# Patient Record
Sex: Male | Born: 1945
Health system: Southern US, Community
[De-identification: ages and names within clinical notes are randomized; demographics above are authoritative.]

## PROBLEM LIST (undated history)

## (undated) DIAGNOSIS — M1990 Unspecified osteoarthritis, unspecified site: Secondary | ICD-10-CM

## (undated) DIAGNOSIS — D696 Thrombocytopenia, unspecified: Principal | ICD-10-CM

## (undated) DIAGNOSIS — I10 Essential (primary) hypertension: Secondary | ICD-10-CM

## (undated) DIAGNOSIS — E119 Type 2 diabetes mellitus without complications: Secondary | ICD-10-CM

## (undated) HISTORY — DX: Type 2 diabetes mellitus without complications: E11.9

## (undated) HISTORY — DX: Essential (primary) hypertension: I10

## (undated) HISTORY — DX: Thrombocytopenia, unspecified: D69.6

## (undated) HISTORY — DX: Unspecified osteoarthritis, unspecified site: M19.90

---

## 2007-04-29 ENCOUNTER — Inpatient Hospital Stay (HOSPITAL_COMMUNITY): Admission: RE | Admit: 2007-04-29 | Discharge: 2007-05-04 | Payer: Self-pay | Admitting: Orthopedic Surgery

## 2008-08-24 ENCOUNTER — Inpatient Hospital Stay (HOSPITAL_COMMUNITY): Admission: EM | Admit: 2008-08-24 | Discharge: 2008-08-27 | Payer: Self-pay | Admitting: Emergency Medicine

## 2010-10-09 LAB — CK: Total CK: 457 U/L — ABNORMAL HIGH (ref 7–232)

## 2010-10-09 LAB — COMPREHENSIVE METABOLIC PANEL
ALT: 50 U/L (ref 0–53)
Alkaline Phosphatase: 119 U/L — ABNORMAL HIGH (ref 39–117)
Calcium: 8.5 mg/dL (ref 8.4–10.5)
Glucose, Bld: 145 mg/dL — ABNORMAL HIGH (ref 70–99)
Sodium: 140 mEq/L (ref 135–145)
Total Bilirubin: 0.8 mg/dL (ref 0.3–1.2)
Total Protein: 6.1 g/dL (ref 6.0–8.3)

## 2010-10-09 LAB — CBC
HCT: 39.4 % (ref 39.0–52.0)
MCHC: 33.7 g/dL (ref 30.0–36.0)
MCV: 84.1 fL (ref 78.0–100.0)

## 2010-10-09 LAB — GLUCOSE, CAPILLARY: Glucose-Capillary: 150 mg/dL — ABNORMAL HIGH (ref 70–99)

## 2010-10-14 LAB — GLUCOSE, CAPILLARY
Glucose-Capillary: 137 mg/dL — ABNORMAL HIGH (ref 70–99)
Glucose-Capillary: 155 mg/dL — ABNORMAL HIGH (ref 70–99)
Glucose-Capillary: 159 mg/dL — ABNORMAL HIGH (ref 70–99)
Glucose-Capillary: 183 mg/dL — ABNORMAL HIGH (ref 70–99)
Glucose-Capillary: 184 mg/dL — ABNORMAL HIGH (ref 70–99)
Glucose-Capillary: 198 mg/dL — ABNORMAL HIGH (ref 70–99)
Glucose-Capillary: 205 mg/dL — ABNORMAL HIGH (ref 70–99)
Glucose-Capillary: 208 mg/dL — ABNORMAL HIGH (ref 70–99)
Glucose-Capillary: 209 mg/dL — ABNORMAL HIGH (ref 70–99)
Glucose-Capillary: 223 mg/dL — ABNORMAL HIGH (ref 70–99)
Glucose-Capillary: 242 mg/dL — ABNORMAL HIGH (ref 70–99)
Glucose-Capillary: 252 mg/dL — ABNORMAL HIGH (ref 70–99)
Glucose-Capillary: 262 mg/dL — ABNORMAL HIGH (ref 70–99)
Glucose-Capillary: 289 mg/dL — ABNORMAL HIGH (ref 70–99)
Glucose-Capillary: 331 mg/dL — ABNORMAL HIGH (ref 70–99)
Glucose-Capillary: 68 mg/dL — ABNORMAL LOW (ref 70–99)
Glucose-Capillary: 89 mg/dL (ref 70–99)
Glucose-Capillary: >600 mg/dL (ref 70–99)
Glucose-Capillary: >600 mg/dL (ref 70–99)
Glucose-Capillary: >600 mg/dL (ref 70–99)

## 2010-10-14 LAB — BASIC METABOLIC PANEL
BUN: 11 mg/dL (ref 6–23)
BUN: 24 mg/dL — ABNORMAL HIGH (ref 6–23)
BUN: 24 mg/dL — ABNORMAL HIGH (ref 6–23)
CO2: 23 mEq/L (ref 19–32)
CO2: 27 mEq/L (ref 19–32)
CO2: 31 mEq/L (ref 19–32)
Calcium: 8.1 mg/dL — ABNORMAL LOW (ref 8.4–10.5)
Calcium: 8.1 mg/dL — ABNORMAL LOW (ref 8.4–10.5)
Chloride: 112 mEq/L (ref 96–112)
Chloride: 117 mEq/L — ABNORMAL HIGH (ref 96–112)
Chloride: 119 mEq/L — ABNORMAL HIGH (ref 96–112)
Creatinine, Ser: 0.94 mg/dL (ref 0.4–1.5)
Creatinine, Ser: 1.51 mg/dL — ABNORMAL HIGH (ref 0.4–1.5)
GFR calc Af Amer: 45 mL/min — ABNORMAL LOW (ref >60)
GFR calc Af Amer: 57 mL/min — ABNORMAL LOW (ref >60)
GFR calc Af Amer: >60 mL/min (ref >60)
GFR calc Af Amer: >60 mL/min (ref >60)
GFR calc non Af Amer: 47 mL/min — ABNORMAL LOW (ref >60)
GFR calc non Af Amer: 57 mL/min — ABNORMAL LOW (ref >60)
GFR calc non Af Amer: >60 mL/min (ref >60)
Glucose, Bld: 186 mg/dL — ABNORMAL HIGH (ref 70–99)
Glucose, Bld: 436 mg/dL — ABNORMAL HIGH (ref 70–99)
Glucose, Bld: 95 mg/dL (ref 70–99)
Potassium: 3.4 mEq/L — ABNORMAL LOW (ref 3.5–5.1)
Potassium: 3.6 mEq/L (ref 3.5–5.1)
Potassium: 4.2 mEq/L (ref 3.5–5.1)
Sodium: 139 mEq/L (ref 135–145)
Sodium: 144 mEq/L (ref 135–145)
Sodium: 154 mEq/L — ABNORMAL HIGH (ref 135–145)
Sodium: 157 mEq/L — ABNORMAL HIGH (ref 135–145)

## 2010-10-14 LAB — COMPREHENSIVE METABOLIC PANEL
ALT: 100 U/L — ABNORMAL HIGH (ref 0–53)
ALT: 52 U/L (ref 0–53)
AST: 53 U/L — ABNORMAL HIGH (ref 0–37)
AST: 67 U/L — ABNORMAL HIGH (ref 0–37)
Albumin: 3 g/dL — ABNORMAL LOW (ref 3.5–5.2)
Albumin: 4.5 g/dL (ref 3.5–5.2)
Alkaline Phosphatase: 131 U/L — ABNORMAL HIGH (ref 39–117)
BUN: 18 mg/dL (ref 6–23)
BUN: 27 mg/dL — ABNORMAL HIGH (ref 6–23)
CO2: 27 mEq/L (ref 19–32)
Calcium: 7.8 mg/dL — ABNORMAL LOW (ref 8.4–10.5)
Calcium: 9.2 mg/dL (ref 8.4–10.5)
Chloride: 108 mEq/L (ref 96–112)
Creatinine, Ser: 1.3 mg/dL (ref 0.4–1.5)
GFR calc Af Amer: 41 mL/min — ABNORMAL LOW (ref >60)
GFR calc Af Amer: >60 mL/min (ref >60)
GFR calc non Af Amer: 56 mL/min — ABNORMAL LOW (ref >60)
Glucose, Bld: 348 mg/dL — ABNORMAL HIGH (ref 70–99)
Potassium: 4.2 mEq/L (ref 3.5–5.1)
Potassium: 5 mEq/L (ref 3.5–5.1)
Sodium: 136 mEq/L (ref 135–145)
Sodium: 141 mEq/L (ref 135–145)
Total Bilirubin: 0.9 mg/dL (ref 0.3–1.2)
Total Protein: 5.8 g/dL — ABNORMAL LOW (ref 6.0–8.3)

## 2010-10-14 LAB — HOMOCYSTEINE: Homocysteine: 18.4 umol/L — ABNORMAL HIGH (ref 4.0–15.4)

## 2010-10-14 LAB — LIPID PANEL
Total CHOL/HDL Ratio: 6.8 RATIO
Triglycerides: 175 mg/dL — ABNORMAL HIGH (ref <150)
VLDL: 35 mg/dL (ref 0–40)

## 2010-10-14 LAB — CBC
HCT: 39.4 % (ref 39.0–52.0)
HCT: 53.1 % — ABNORMAL HIGH (ref 39.0–52.0)
Hemoglobin: 12.9 g/dL — ABNORMAL LOW (ref 13.0–17.0)
Hemoglobin: 15.8 g/dL (ref 13.0–17.0)
Hemoglobin: 16.8 g/dL (ref 13.0–17.0)
MCHC: 31.6 g/dL (ref 30.0–36.0)
MCHC: 32.8 g/dL (ref 30.0–36.0)
MCHC: 33.5 g/dL (ref 30.0–36.0)
MCHC: 33.6 g/dL (ref 30.0–36.0)
MCV: 84.9 fL (ref 78.0–100.0)
Platelets: 98 10*3/uL — ABNORMAL LOW (ref 150–400)
RBC: 5.54 MIL/uL (ref 4.22–5.81)
RDW: 14.3 % (ref 11.5–15.5)
RDW: 14.6 % (ref 11.5–15.5)
RDW: 14.6 % (ref 11.5–15.5)
RDW: 14.6 % (ref 11.5–15.5)
WBC: 6.9 10*3/uL (ref 4.0–10.5)

## 2010-10-14 LAB — HEPATIC FUNCTION PANEL
Albumin: 4.5 g/dL (ref 3.5–5.2)
Alkaline Phosphatase: 221 U/L — ABNORMAL HIGH (ref 39–117)
Bilirubin, Direct: <0.1 mg/dL (ref 0.0–0.3)

## 2010-10-14 LAB — POCT I-STAT 3, VENOUS BLOOD GAS (G3P V)
Acid-Base Excess: 4 mmol/L — ABNORMAL HIGH (ref 0.0–2.0)
O2 Saturation: 51 %
pCO2, Ven: 60.4 mmHg — ABNORMAL HIGH (ref 45.0–50.0)
pH, Ven: 7.345 — ABNORMAL HIGH (ref 7.250–7.300)

## 2010-10-14 LAB — DIFFERENTIAL
Eosinophils Absolute: 0 10*3/uL (ref 0.0–0.7)
Eosinophils Relative: 0 % (ref 0–5)
Lymphocytes Relative: 8 % — ABNORMAL LOW (ref 12–46)
Neutrophils Relative %: 88 % — ABNORMAL HIGH (ref 43–77)

## 2010-10-14 LAB — CK TOTAL AND CKMB (NOT AT ARMC)
CK, MB: 2.8 ng/mL (ref 0.3–4.0)
Relative Index: 0.5 (ref 0.0–2.5)
Total CK: 610 U/L — ABNORMAL HIGH (ref 7–232)

## 2010-10-14 LAB — HEMOGLOBIN A1C: Mean Plasma Glucose: 243 mg/dL

## 2010-10-14 LAB — PROTIME-INR
INR: 1.1 (ref 0.00–1.49)
Prothrombin Time: 15 seconds (ref 11.6–15.2)

## 2010-10-14 LAB — CK: Total CK: 756 U/L — ABNORMAL HIGH (ref 7–232)

## 2010-10-14 LAB — TROPONIN I: Troponin I: 0.01 ng/mL (ref 0.00–0.06)

## 2010-10-14 LAB — CARDIAC PANEL(CRET KIN+CKTOT+MB+TROPI)
CK, MB: 3.4 ng/mL (ref 0.3–4.0)
Troponin I: 0.01 ng/mL (ref 0.00–0.06)

## 2010-10-14 LAB — APTT: aPTT: 36 seconds (ref 24–37)

## 2010-11-11 NOTE — H&P (Signed)
NAME:  Donald Mclaughlin, Donald Mclaughlin.:  [id number]   MEDICAL RECORD NO.:  [id number]          PATIENT TYPE:  EMS   LOCATION:  MAJO                         FACILITY:  [facility]   PHYSICIAN:  Vania Rea, M.D. DATE OF BIRTH:  1945-12-01   DATE OF ADMISSION:  08/23/2008  DATE OF DISCHARGE:                              HISTORY & PHYSICAL   PRIMARY CARE PHYSICIAN:  Ernestina Penna, M.D. at [facility].   CHIEF COMPLAINT:  Progressive fatigue x1 week.   HISTORY OF PRESENT ILLNESS:  This is a 65 year old African American  gentleman who denies any significant past medical history other than a  hip replacement in December, 2008.  He does not usually visit doctors  and feels that he was in good health until about 1 week ago when he  started noticing progressive fatigue.  Over the past 2 to 3 days it has  become much worse.  His daughter reports that he has been complaining  that his mouth is very dry.  He has been having blurred vision,  increasing urinary frequency, and thirst.  Eventually, his daughter  persuaded him to come to the emergency room, where he was found to have  a markedly elevated blood sugar, and hospitalist service called to  assist with management.  Patient denies any family history of diabetes.  He specifically denies alcohol use.  His daughter feels that he is  losing weight.  He does not notice any weight loss.  He has not noticed  any change in the girth of his abdomen.   PAST MEDICAL HISTORY:  Osteoarthritis and right hip replacement in  December, 2008.   MEDICATIONS:  None.   ALLERGIES:  No known drug allergies.   SOCIAL HISTORY:  Used to smoke a pack of 5 cigars every 2 days and has  smoked for about 30 years but discontinued at the beginning of this  year.  Denies any alcohol or illicit drug use.  Used to work for [employer].   FAMILY HISTORY:  Significant for a mother who died of cancer in her  back.  Unable to locate  any further but denies any family history of  other medical problems.   REVIEW OF SYSTEMS:  Other than noted above, a 10-point review of systems  is negative for any questions.   PHYSICAL EXAMINATION:  A dehydrated middle-aged [place] American  gentleman reclining in the stretcher.  VITALS:  Temperature is not recorded.  He is afebrile.  His pulse is  104, respirations 18, blood pressure 124/78.  He is saturating at 94% on  room air.  Pupils are equal and round.  Mucous membranes are pink and anicteric.  He is dehydrated.  No cervical lymphadenopathy or thyromegaly.  No  jugular venous distention.  CHEST:  Clear to auscultation bilaterally.  CARDIOVASCULAR:  Regular rhythm without murmur.  ABDOMEN:  Obese.  He does have flank dullness but umbilicus is not  diverted.  EXTREMITIES:  Without edema.  He has 2+ pulses bilaterally.  SKIN:  Without rash.  CENTRAL NERVOUS SYSTEM:  Cranial nerves II-XII  are grossly intact.  He  has no focal neurological deficits.   LABS:  White count is 6.9, hemoglobin 16.8, platelets 115.  Absolute  granulocyte count is elevated at 88.  Serum chemistry is significant for  a sodium of 136, potassium 5, chloride 92, CO2 29, glucose 1312, BUN 27,  creatinine 2.7.  calcium is 9.2, total protein 9.1, albumin 4.5, AST 67,  ALT 100, alkaline phosphatase 213.  Total bilirubin is 0.8.  His pH on a  venous blood sample is 7.34.   A 2-view chest x-ray shows atelectasis or scarring at both bases.  Could  not rule out atelectatic pneumonia.   ASSESSMENT:  1. Newly discovered diabetes.  2. Hyperosmolar nonketotic state.  3. Thrombocytopenia.  4. Abnormal liver function tests.  5. Acute renal failure.   PLAN:  We will admit this gentleman for hydration.  We will monitor his  serum chemistry every 4 hours.  We will place him on Glucommander.  Sudden and apparently rapid onset of this diabetes is concerning for  malignancy.  We will get an ultrasound of his abdomen  with particular  attention to his liver and his pancreas.  Other plans as per orders.      Vania Rea, M.D.  Electronically Signed     LC/MEDQ  D:  08/24/2008  T:  08/24/2008  Job:  [id number]   cc:   Ernestina Penna, M.D.  Lorre Nick, M.D.

## 2010-11-11 NOTE — Discharge Summary (Signed)
NAME:  Donald Mclaughlin, CUPP NO.:  [id number]   MEDICAL RECORD NO.:  [id number]          PATIENT TYPE:  INP   LOCATION:  4705                         FACILITY:  [facility]   PHYSICIAN:  Marcellus Scott, MD     DATE OF BIRTH:  September 10, 1945   DATE OF ADMISSION:  08/23/2008  DATE OF DISCHARGE:  08/27/2008                               DISCHARGE SUMMARY   PRIMARY MEDICAL DOCTOR:  Gentry Fitz.   DISCHARGE DIAGNOSES:  1. Newly diagnosed uncontrolled type 2 diabetes mellitus.      Hyperosmolar nonketotic state resolved.  2. Dehydration - resolved.  3. Acute renal failure - resolved.  4. Mild rhabdomyolysis, improving.  5. Thrombocytopenia, ? chronic and stable.  6. Fatty Liver/Abnormal liver function tests - improving.  7. Hyperlipidemia.   DISCHARGE MEDICATIONS:  1. Lantus SoloStar 28 units subcutaneously daily.  2. NovoLog 3 units subcut t.i.d. with meals.  3. NovoLog sliding scale insulin as per direction.   PROCEDURES:  Ultrasound, abdomen, on August 24, 2008, impression:  Marked, diffuse fatty infiltration of the liver.   Chest x-ray on August 23, 2008:  Linear atelectasis or scar of both  lung bases.  The radiologist could not rule out an atelectatic  pneumonia.   PERTINENT LABS:  Comprehensive metabolic panel today with BUN 11,  creatinine 0.94, alk phos 119, AST 47, ALT 50, albumin 3.1, creatinine  kinase 457.   CBCs with hemoglobin 13.3, hematocrit 39.4, white blood cell 4.6,  platelets 97,000.  Homocystine 18.4.  hemoglobin A1c is unable to  determine.  Recommended glycohemoglobin.  Requested today.   TSH is 0.466.   Lipid panel with cholesterol 219, triglycerides 175, HDL 32, LDL 152,  VLDL 35.   CONSULTATIONS:  None.   HOSPITAL COURSE AND PATIENT DISPOSITION:  Please refer to the history  and physical note for initial admission details.  In summary, Mr. Donald Mclaughlin  is a pleasant, 65 year old, African American male patient with history  of osteoarthritis and  right hip replacement, who presented with  progressive fatigue, dry mouth, blurred vision, urinary frequency, and  thirst.  He was brought to the Emergency Room where he had markedly  elevated blood sugar in the 1100 range.  He was thereby admitted to the  hospital for further evaluation and management.   1. Newly diagnosed type 2 diabetes mellitus.  The patient was admitted      to the hospital.  He was placed on aggressive IV fluid hydration      and a Glucommander insulin drip.  His basic metabolic panels were      closely followed.  With these measures, once the blood sugars were      significantly better, he was transitioned to Lantus and NovoLog      meal time and sliding scale insulin.  These were adjusted.  His      current sugars range between 150 mg/dL to 045 mg/dL.  The patient      has no further polyuria.  He feels stronger.  He has been educated      regarding diabetes including his CBG checks, insulin  administration, hypoglycemic symptoms and management.  He will be      referred for outpatient diabetes education as well.  The      hyperosmolar nonketotic state has resolved.  2. Dehydration and acute renal failure secondary to problem #1      resolved.  3. Mild rhabdomyolysis.  The patient did not have any symptoms.      However, with IV hydration, his numbers decreased from 1800 to      current numbers.  He has been advised ad-lib p.o. liquids.  We did      not start statins in hospital given this.  We will recommend      considering this as an outpatient.  4. Thrombocytopenia.  Probably chronic and stable.  Consider      outpatient evaluation as deemed necessary.  5. Abnormal liver function tests.  Question secondary to fatty liver      and rhabdomyolysis.  Recommend repeating hepatic panel when he sees      the primary medical doctor.  6. Hyperlipidemia.  As per discussion above.   At this time, the patient is stable for discharge home.  He does not  have a  primary medical doctor.  We will provide a contact for same.  Recommend repeating CBC, complete metabolic panel, and total CK in 1-2  weeks.   Time spent in coordinating this discharge is 35 minutes.      Marcellus Scott, MD  Electronically Signed     AH/MEDQ  D:  08/27/2008  T:  08/27/2008  Job:  [id number]

## 2010-11-11 NOTE — Op Note (Signed)
NAME:  Donald Mclaughlin, Donald Mclaughlin NO.:  [id number]   MEDICAL RECORD NO.:  [id number]          PATIENT TYPE:  INP   LOCATION:  1615                         FACILITY:  [facility]   PHYSICIAN:  John L. Rendall, M.D.  DATE OF BIRTH:  12/10/1945   DATE OF PROCEDURE:  04/29/2007  DATE OF DISCHARGE:                               OPERATIVE REPORT   PREOPERATIVE DIAGNOSIS:  Osteoarthritis right hip - end stage.   SURGICAL PROCEDURE:  Right total hip arthroplasty.   POSTOPERATIVE DIAGNOSIS:  Osteoarthritis right hip - end stage.   SURGEON:  John L. Rendall, M.D.   ASSISTANT:  Legrand Pitts. Duffy, P.A.-C.   ANESTHESIA:  General.   PATHOLOGY:  Bone against bone right hip with badly eburnated femoral  head spurs all about it and spurs all about the acetabulum.  The hip was  originally a case of shallow acetabulum that the femoral head was  uncovered about 40% that probably lead to its early wear out.   PROCEDURE:  Under general anesthesia, the patient is placed in the left  lateral decubitus position and the left hip was prepared with DuraPrep  and draped as a sterile field. Kefzol was given preoperatively.  The  posterior approach was used, splitting the IT band in the line of its  fibers, inserting a long Charnley retractor.  The patient had flexion  and external rotation contracture. The hip was internally rotated as  much as possible and the short external rotators and hip capsule were  taken down from the bone using electrocautery.  A Cobb elevator was used  to separate the soft tissue from the bone and dissection was carried in  this manner using the electrocautery at the base of the greater  trochanter right to the femoral neck. A sort of J-shaped incision of the  hip capsule was made.  No dissection vertically medially.  Once this was  completed, the hip was internally rotated and the hip dislocated.   The superior femoral neck was exposed.  The IM initiator and canal  finder were  used.  It was then sequentially reamed to a 16 mm diameter.  The femoral neck was carefully exposed and a femoral neck cut was made.  Once this was completed, rasping was done with 12.5, 15, and 16.5  narrow.  The 16.5 narrow gave a good fit.  It was felt that the femoral  neck length was perhaps a little long and shortened about 1/8 of an  inch. The calcar reamer was then used around the rasp.  Once the femur  was thus prepared, attention was turned to the acetabulum.  Careful  dissection was done along the rim of the acetabulum peeling back the  capsule, inserting wing retractors large, both superiorly and inferiorly  along the back wall of the acetabulum.  Once these were in place, the  labrum and bone spurs in the labral tissue were removed.  The ligamentum  teres was removed.  Reaming was then initiated with a 47 going to 49,  51, 53, 55, 56, 57 and trial of a 56 gave a  good fit in the bottom of  the acetabulum with circumferential fill. The decision was then made to  use the Pinnacle cup 100 series, size 58 mm. This was inserted using the  sputnik to help position it.  It seated nicely.  A trial of the  acetabular liner plus 4 10 degrees angle was then used for a 36 mm hip  ball.  This was trialed and with the short neck hip ball on the rasp, it  would tend to dislocate in internal rotation. Changing to the anteverted  stem, it was more stable, but changing to the +5 neck length, it was  dramatically more stable and the leg lengths were felt to be equal to  preop.   At this point, permanent components were obtained.  The poly liner trial  was removed, an apex hole eliminator was inserted, and the Pinnacle  Marathon liner plus 4 10 degrees angle for the 36 inside diameter 58  outside diameter was inserted.  The Prodigy femoral stem 16.5 mm fully  porous coated small stature was inserted and seated on the calcar  nicely.  The +5 36 hip ball was then inserted.  The hip was reduced.   It  was found stable through a full normal range of motion.  The hip would  now extend fully and flex fully and straight abduction, it was fully  stable in.  I could internally rotate to 40 degrees in a full flexed  position before any subluxation would begin to occur. At this point, the  hip capsule was repaired with #1 Tycron, the IT band with #1 Tycron,  subcu with #1 and 2-0 Vicryl, and skin clips.  Operating time was  approximately 1 hour and 20 minutes.  Blood loss was approximately 700  mL.  The patient returned to recovery in good condition.      John L. Rendall, M.D.  Electronically Signed     JLR/MEDQ  D:  04/29/2007  T:  04/29/2007  Job:  [id number]

## 2011-04-07 LAB — BASIC METABOLIC PANEL
BUN: 16
BUN: 19
BUN: 34 — ABNORMAL HIGH
CO2: 25
CO2: 26
Calcium: 8.3 — ABNORMAL LOW
Calcium: 8.4
Chloride: 103
Creatinine, Ser: 1.21
GFR calc non Af Amer: 42 — ABNORMAL LOW
GFR calc non Af Amer: 50 — ABNORMAL LOW
GFR calc non Af Amer: >60
GFR calc non Af Amer: >60
GFR calc non Af Amer: >60
Glucose, Bld: 103 — ABNORMAL HIGH
Glucose, Bld: 114 — ABNORMAL HIGH
Glucose, Bld: 121 — ABNORMAL HIGH
Glucose, Bld: 133 — ABNORMAL HIGH
Potassium: 3.2 — ABNORMAL LOW
Potassium: 3.4 — ABNORMAL LOW
Potassium: 4.2
Potassium: 4.2
Sodium: 134 — ABNORMAL LOW
Sodium: 138
Sodium: 139

## 2011-04-07 LAB — CBC
HCT: 22 — ABNORMAL LOW
HCT: 25.5 — ABNORMAL LOW
HCT: 26.7 — ABNORMAL LOW
Hemoglobin: 8.7 — ABNORMAL LOW
Hemoglobin: 9 — ABNORMAL LOW
MCHC: 34.1
MCHC: 34.2
MCV: 83.8
Platelets: 111 — ABNORMAL LOW
Platelets: 126 — ABNORMAL LOW
Platelets: 171
RDW: 13.9
RDW: 14.2 — ABNORMAL HIGH
RDW: 14.2 — ABNORMAL HIGH
WBC: 7.5

## 2011-04-08 LAB — COMPREHENSIVE METABOLIC PANEL
ALT: 29
BUN: 12
CO2: 27
Calcium: 9.3
Creatinine, Ser: 0.99
GFR calc non Af Amer: >60
Glucose, Bld: 117 — ABNORMAL HIGH
Sodium: 140
Total Protein: 7.4

## 2011-04-08 LAB — APTT: aPTT: 45 — ABNORMAL HIGH

## 2011-04-08 LAB — URINALYSIS, ROUTINE W REFLEX MICROSCOPIC
Glucose, UA: NEGATIVE
Ketones, ur: NEGATIVE
Nitrite: NEGATIVE
Specific Gravity, Urine: 1.024
pH: 6

## 2011-04-08 LAB — CROSSMATCH
ABO/RH(D): A POS
ABO/RH(D): A POS
Antibody Screen: NEGATIVE

## 2011-04-08 LAB — DIFFERENTIAL
Basophils Absolute: 0
Basophils Relative: 0
Eosinophils Relative: 1
Lymphocytes Relative: 38
Monocytes Absolute: 0.4

## 2011-04-08 LAB — CBC
HCT: 38.1 — ABNORMAL LOW
Hemoglobin: 12.7 — ABNORMAL LOW
MCHC: 33.4
MCV: 83.6
RBC: 4.56
RDW: 13.9

## 2011-04-08 LAB — URINE MICROSCOPIC-ADD ON

## 2011-04-08 LAB — URINE CULTURE

## 2011-04-08 LAB — PROTIME-INR: Prothrombin Time: 12.4

## 2011-06-30 HISTORY — PX: HIP SURGERY: SHX245

## 2011-07-15 ENCOUNTER — Ambulatory Visit (HOSPITAL_COMMUNITY)
Admission: RE | Admit: 2011-07-15 | Discharge: 2011-07-15 | Disposition: A | Discharge status: Home / Self Care | Payer: PRIVATE HEALTH INSURANCE | Source: Ambulatory Visit | Attending: Internal Medicine | Admitting: Internal Medicine

## 2011-07-15 ENCOUNTER — Other Ambulatory Visit (HOSPITAL_COMMUNITY): Payer: Self-pay | Admitting: Internal Medicine

## 2011-07-15 DIAGNOSIS — R52 Pain, unspecified: Secondary | ICD-10-CM

## 2011-07-15 DIAGNOSIS — M25519 Pain in unspecified shoulder: Secondary | ICD-10-CM | POA: Insufficient documentation

## 2011-07-15 DIAGNOSIS — M19019 Primary osteoarthritis, unspecified shoulder: Secondary | ICD-10-CM | POA: Insufficient documentation

## 2011-09-29 ENCOUNTER — Ambulatory Visit: Payer: PRIVATE HEALTH INSURANCE | Attending: Orthopedic Surgery | Admitting: Physical Therapy

## 2011-09-29 DIAGNOSIS — IMO0001 Reserved for inherently not codable concepts without codable children: Secondary | ICD-10-CM | POA: Insufficient documentation

## 2011-09-29 DIAGNOSIS — M25559 Pain in unspecified hip: Secondary | ICD-10-CM | POA: Insufficient documentation

## 2011-09-29 DIAGNOSIS — R5381 Other malaise: Secondary | ICD-10-CM | POA: Insufficient documentation

## 2011-10-05 ENCOUNTER — Encounter: Payer: PRIVATE HEALTH INSURANCE | Admitting: Physical Therapy

## 2011-10-13 ENCOUNTER — Ambulatory Visit: Payer: PRIVATE HEALTH INSURANCE | Admitting: Physical Therapy

## 2011-11-02 ENCOUNTER — Ambulatory Visit: Payer: PRIVATE HEALTH INSURANCE | Attending: Orthopedic Surgery | Admitting: Physical Therapy

## 2011-11-02 DIAGNOSIS — IMO0001 Reserved for inherently not codable concepts without codable children: Secondary | ICD-10-CM | POA: Insufficient documentation

## 2011-11-02 DIAGNOSIS — R5381 Other malaise: Secondary | ICD-10-CM | POA: Insufficient documentation

## 2011-11-02 DIAGNOSIS — M25559 Pain in unspecified hip: Secondary | ICD-10-CM | POA: Insufficient documentation

## 2013-05-08 ENCOUNTER — Encounter: Payer: Self-pay | Admitting: Podiatry

## 2013-05-08 ENCOUNTER — Ambulatory Visit (INDEPENDENT_AMBULATORY_CARE_PROVIDER_SITE_OTHER): Payer: Medicare Other | Admitting: Podiatry

## 2013-05-08 VITALS — BP 146/82 | HR 67 | Ht 65.0 in | Wt 222.0 lb

## 2013-05-08 DIAGNOSIS — B351 Tinea unguium: Secondary | ICD-10-CM

## 2013-05-08 DIAGNOSIS — M79673 Pain in unspecified foot: Secondary | ICD-10-CM | POA: Insufficient documentation

## 2013-05-08 DIAGNOSIS — M79609 Pain in unspecified limb: Secondary | ICD-10-CM

## 2013-05-08 DIAGNOSIS — E119 Type 2 diabetes mellitus without complications: Secondary | ICD-10-CM

## 2013-05-08 NOTE — Progress Notes (Signed)
Seen for diabetic foot care. No new problems other than thick problematic toe nails. Blood sugar is in 95-115 range.  Objective: Thick dystrophic nails x 10. Mild bunion with hallux valgus bilateral. Neurovascular status are within normal.  Assessment: Mycotic nails x 10. Painful nails bilateral.  Plan: All nails debrided.  Return as needed.

## 2013-05-08 NOTE — Patient Instructions (Signed)
Seen for hypertrophic nails. All nails debrided. Return in 3 months or as needed.  

## 2013-12-02 ENCOUNTER — Ambulatory Visit: Payer: Medicare Other | Admitting: Family Medicine

## 2015-05-08 ENCOUNTER — Ambulatory Visit
Admission: RE | Admit: 2015-05-08 | Discharge: 2015-05-08 | Disposition: A | Discharge status: Home / Self Care | Payer: Medicare Other | Source: Ambulatory Visit | Attending: Internal Medicine | Admitting: Internal Medicine

## 2015-05-08 ENCOUNTER — Other Ambulatory Visit: Payer: Self-pay | Admitting: Internal Medicine

## 2015-05-08 DIAGNOSIS — R059 Cough, unspecified: Secondary | ICD-10-CM

## 2015-05-08 DIAGNOSIS — R05 Cough: Secondary | ICD-10-CM

## 2015-10-08 ENCOUNTER — Other Ambulatory Visit: Payer: Self-pay | Admitting: Internal Medicine

## 2015-10-08 DIAGNOSIS — M79605 Pain in left leg: Secondary | ICD-10-CM

## 2015-10-08 DIAGNOSIS — R0989 Other specified symptoms and signs involving the circulatory and respiratory systems: Secondary | ICD-10-CM

## 2015-10-22 ENCOUNTER — Other Ambulatory Visit: Payer: Medicare Other

## 2015-10-24 ENCOUNTER — Ambulatory Visit
Admission: RE | Admit: 2015-10-24 | Discharge: 2015-10-24 | Disposition: A | Discharge status: Home / Self Care | Payer: Medicare Other | Source: Ambulatory Visit | Attending: Internal Medicine | Admitting: Internal Medicine

## 2015-10-24 DIAGNOSIS — M79605 Pain in left leg: Secondary | ICD-10-CM

## 2015-10-24 DIAGNOSIS — R0989 Other specified symptoms and signs involving the circulatory and respiratory systems: Secondary | ICD-10-CM

## 2015-12-02 ENCOUNTER — Ambulatory Visit (HOSPITAL_COMMUNITY): Payer: Medicare Other | Admitting: Oncology

## 2015-12-11 ENCOUNTER — Ambulatory Visit (HOSPITAL_COMMUNITY): Payer: Medicare Other | Admitting: Oncology

## 2015-12-31 ENCOUNTER — Encounter (HOSPITAL_COMMUNITY): Payer: Self-pay | Admitting: Oncology

## 2015-12-31 DIAGNOSIS — D696 Thrombocytopenia, unspecified: Secondary | ICD-10-CM

## 2015-12-31 HISTORY — DX: Thrombocytopenia, unspecified: D69.6

## 2015-12-31 NOTE — Progress Notes (Signed)
-  No Show-  Letter sent to primary care provider.

## 2016-01-01 ENCOUNTER — Ambulatory Visit (HOSPITAL_COMMUNITY): Payer: Medicare Other | Admitting: Oncology

## 2016-07-30 DIAGNOSIS — E1165 Type 2 diabetes mellitus with hyperglycemia: Secondary | ICD-10-CM | POA: Diagnosis not present

## 2016-07-30 DIAGNOSIS — M1712 Unilateral primary osteoarthritis, left knee: Secondary | ICD-10-CM | POA: Diagnosis not present

## 2016-07-30 DIAGNOSIS — E119 Type 2 diabetes mellitus without complications: Secondary | ICD-10-CM | POA: Diagnosis not present

## 2016-08-26 ENCOUNTER — Ambulatory Visit: Payer: Medicare Other | Admitting: Orthopaedic Surgery

## 2016-09-01 DIAGNOSIS — I10 Essential (primary) hypertension: Secondary | ICD-10-CM | POA: Diagnosis not present

## 2016-09-01 DIAGNOSIS — E119 Type 2 diabetes mellitus without complications: Secondary | ICD-10-CM | POA: Diagnosis not present

## 2016-09-01 DIAGNOSIS — E785 Hyperlipidemia, unspecified: Secondary | ICD-10-CM | POA: Diagnosis not present

## 2016-09-10 ENCOUNTER — Ambulatory Visit (INDEPENDENT_AMBULATORY_CARE_PROVIDER_SITE_OTHER): Payer: Medicare Other

## 2016-09-10 ENCOUNTER — Encounter: Payer: Self-pay | Admitting: Orthopaedic Surgery

## 2016-09-10 ENCOUNTER — Ambulatory Visit (INDEPENDENT_AMBULATORY_CARE_PROVIDER_SITE_OTHER): Payer: Medicare Other | Admitting: Orthopaedic Surgery

## 2016-09-10 VITALS — BP 122/72 | HR 71 | Ht 67.0 in | Wt 197.0 lb

## 2016-09-10 DIAGNOSIS — G8929 Other chronic pain: Secondary | ICD-10-CM

## 2016-09-10 DIAGNOSIS — M25562 Pain in left knee: Secondary | ICD-10-CM

## 2016-09-10 MED ORDER — NAPROXEN 500 MG PO TABS: 500.0000 mg | ORAL_TABLET | Freq: Two times a day (BID) | ORAL | 5 refills | Status: AC

## 2016-09-10 NOTE — Progress Notes (Signed)
Subjective:    Patient ID: Donald Mclaughlin, male    DOB: 1945-10-25, 71 y.o.   MRN: [id number]  HPI He has had knee pain on the left for years getting much worse over the last three months or so.  He uses a cane and has a knee brace for the left knee.  He has swelling, popping and giving way of the knee.  He has no trauma, no redness.  He is not taking anything for it.  He has used liniments and ice with little help. He has seen Dr. Felecia ShellingFanta.  He is tired of his knee hurting.   Review of Systems  HENT: Negative for congestion.   Respiratory: Negative for cough and shortness of breath.   Cardiovascular: Negative for chest pain and leg swelling.  Endocrine: Negative for cold intolerance.  Musculoskeletal: Negative for arthralgias, gait problem and joint swelling.  Allergic/Immunologic: Negative for environmental allergies.   Past Medical History:  Diagnosis Date  . Arthritis   . Diabetes mellitus without complication (HCC)   . High blood pressure   . Thrombocytopenia (HCC) 12/31/2015    Past Surgical History:  Procedure Laterality Date  . HIP SURGERY Right 2013   arthroplasty    No current outpatient prescriptions on file prior to visit.   No current facility-administered medications on file prior to visit.     Social History   Social History  . Marital status: Legally Separated    Spouse name: N/A  . Number of children: N/A  . Years of education: N/A   Occupational History  . Not on file.   Social History Main Topics  . Smoking status: Former Games developermoker  . Smokeless tobacco: Never Used  . Alcohol use Not on file  . Drug use: Unknown  . Sexual activity: Not on file   Other Topics Concern  . Not on file   Social History Narrative  . No narrative on file    Family History  Problem Relation Age of Onset  . Hypertension Mother     BP 122/72   Pulse 71   Ht 5\' 7"  (1.702 m)   Wt 197 lb (89.4 kg)   BMI 30.85 kg/m      Objective:   Physical Exam  Constitutional:  He is oriented to person, place, and time. He appears well-developed and well-nourished.  HENT:  Head: Normocephalic and atraumatic.  Eyes: Conjunctivae and EOM are normal. Pupils are equal, round, and reactive to light.  Neck: Normal range of motion. Neck supple.  Cardiovascular: Normal rate, regular rhythm and intact distal pulses.   Pulmonary/Chest: Effort normal.  Abdominal: Soft.  Musculoskeletal: He exhibits tenderness (Left knee with effusion 2+, stable, crepitus and medial joint line pain.  ROM 0 to 95 with pain.  Limp to the  left.  Right knee with good motion and crepitus.).  Neurological: He is alert and oriented to person, place, and time. He has normal reflexes. He displays normal reflexes. No cranial nerve deficit. He exhibits normal muscle tone. Coordination normal.  Skin: Skin is warm and dry.  Psychiatric: He has a normal mood and affect. His behavior is normal. Judgment and thought content normal.  Vitals reviewed.  X-rays were done of the left knee, reported separately.      Assessment & Plan:   Encounter Diagnosis  Name Primary?  . Chronic pain of left knee Yes   PROCEDURE NOTE:  The patient request injection, verbal consent was obtained.  The left knee was prepped appropriately  after time out was performed.   Sterile technique was observed and anesthesia was provided by ethyl chloride and a 20-gauge needle was used to inject the knee area.  A 16-gauge needle was then used to aspirate the knee.  Color of fluid aspirated was straw colored  Total cc's aspirated was 35.    Injection of 1 cc of Depo-Medrol 40 mg with several cc's of plain xylocaine was then performed.  A band aid dressing was applied.  The patient was advised to apply ice later today and tomorrow to the injection sight as needed.  I have given Rx for Naproysn.  Return in three weeks.  He is a candidate for a total knee.  I want to see how he responds to the medicine first.  Call if any  problem.  Precautions discussed.  Electronically Signed Darreld Mclean, MD 3/15/201810:15 AM

## 2016-10-01 ENCOUNTER — Ambulatory Visit: Payer: Medicare Other | Admitting: Orthopaedic Surgery

## 2016-10-15 ENCOUNTER — Ambulatory Visit: Payer: Medicare Other | Admitting: Orthopaedic Surgery

## 2016-11-02 ENCOUNTER — Ambulatory Visit: Payer: Self-pay | Admitting: Family Medicine

## 2016-11-12 ENCOUNTER — Ambulatory Visit: Payer: Self-pay | Admitting: Family

## 2016-11-13 ENCOUNTER — Encounter: Payer: Self-pay | Admitting: Family

## 2017-08-12 DIAGNOSIS — E109 Type 1 diabetes mellitus without complications: Secondary | ICD-10-CM | POA: Diagnosis not present

## 2017-11-25 DIAGNOSIS — E109 Type 1 diabetes mellitus without complications: Secondary | ICD-10-CM | POA: Diagnosis not present

## 2018-01-18 DIAGNOSIS — E119 Type 2 diabetes mellitus without complications: Secondary | ICD-10-CM | POA: Diagnosis not present

## 2018-01-18 DIAGNOSIS — I10 Essential (primary) hypertension: Secondary | ICD-10-CM | POA: Diagnosis not present

## 2018-01-18 DIAGNOSIS — Z0001 Encounter for general adult medical examination with abnormal findings: Secondary | ICD-10-CM | POA: Diagnosis not present

## 2018-01-18 DIAGNOSIS — E785 Hyperlipidemia, unspecified: Secondary | ICD-10-CM | POA: Diagnosis not present

## 2018-01-18 DIAGNOSIS — Z1389 Encounter for screening for other disorder: Secondary | ICD-10-CM | POA: Diagnosis not present

## 2018-01-20 ENCOUNTER — Telehealth: Payer: Self-pay | Admitting: Orthopaedic Surgery

## 2018-01-20 NOTE — Telephone Encounter (Signed)
We received referral in [place] for pt's left knee.  He has previously seen Dr. Hilda LiasKeeling for this back in March of this year.  I called him and tried to schedule him.  He said he had an upcoming eye doctor appointment that he didn't want to miss.  He said he would check on that and would call the office back to schedule the appointment.

## 2018-02-10 ENCOUNTER — Ambulatory Visit: Payer: Self-pay | Admitting: Orthopaedic Surgery

## 2018-02-21 ENCOUNTER — Encounter: Payer: Self-pay | Admitting: Orthopaedic Surgery

## 2018-03-16 ENCOUNTER — Ambulatory Visit: Payer: Medicare Other

## 2018-04-18 DIAGNOSIS — Z23 Encounter for immunization: Secondary | ICD-10-CM | POA: Diagnosis not present

## 2018-04-18 DIAGNOSIS — M1712 Unilateral primary osteoarthritis, left knee: Secondary | ICD-10-CM | POA: Diagnosis not present

## 2018-04-18 DIAGNOSIS — E1165 Type 2 diabetes mellitus with hyperglycemia: Secondary | ICD-10-CM | POA: Diagnosis not present

## 2018-04-18 DIAGNOSIS — I10 Essential (primary) hypertension: Secondary | ICD-10-CM | POA: Diagnosis not present

## 2018-04-18 DIAGNOSIS — E785 Hyperlipidemia, unspecified: Secondary | ICD-10-CM | POA: Diagnosis not present

## 2018-08-09 ENCOUNTER — Ambulatory Visit: Payer: Self-pay | Admitting: Orthopaedic Surgery

## 2018-08-25 ENCOUNTER — Encounter: Payer: Self-pay | Admitting: Orthopaedic Surgery

## 2018-08-30 ENCOUNTER — Encounter: Payer: Self-pay | Admitting: Orthopaedic Surgery

## 2018-08-30 ENCOUNTER — Ambulatory Visit: Payer: Medicare Other | Admitting: Orthopaedic Surgery

## 2018-08-30 ENCOUNTER — Other Ambulatory Visit: Payer: Self-pay

## 2018-08-30 DIAGNOSIS — G8929 Other chronic pain: Secondary | ICD-10-CM

## 2018-08-30 DIAGNOSIS — M25562 Pain in left knee: Secondary | ICD-10-CM

## 2018-08-30 MED ORDER — HYDROCODONE-ACETAMINOPHEN 5-325 MG PO TABS: ORAL_TABLET | ORAL | 0 refills | Status: DC

## 2018-08-30 NOTE — Progress Notes (Signed)
Patient Donald Mclaughlin, male DOB:1945/09/26, 73 y.o. NGE:[id number]  Chief Complaint  Patient presents with  . Knee Pain    left     HPI  Donald Mclaughlin is a 73 y.o. male who has had pain in the left knee.  I have not seen him in two years.  He has done well until he fell about two weeks ago and twisted his left knee.  He has had swelling and pain and popping but no giving way.  He did not hit his knee.  He has tried ice, elevation and Tylenol with no help.   There is no height or weight on file to calculate BMI.  ROS  Review of Systems  Constitutional: Positive for activity change.  HENT: Negative for congestion.   Respiratory: Negative for cough and shortness of breath.   Cardiovascular: Negative for chest pain and leg swelling.  Endocrine: Negative for cold intolerance.  Musculoskeletal: Positive for gait problem and joint swelling. Negative for arthralgias.  Allergic/Immunologic: Negative for environmental allergies.  All other systems reviewed and are negative.   All other systems reviewed and are negative.  The following is a summary of the past history medically, past history surgically, known current medicines, social history and family history.  This information is gathered electronically by the computer from prior information and documentation.  I review this each visit and have found including this information at this point in the chart is beneficial and informative.    Past Medical History:  Diagnosis Date  . Arthritis   . Diabetes mellitus without complication (HCC)   . High blood pressure   . Thrombocytopenia (HCC) 12/31/2015    Past Surgical History:  Procedure Laterality Date  . HIP SURGERY Right 2013   arthroplasty    Family History  Problem Relation Age of Onset  . Hypertension Mother     Social History Social History   Tobacco Use  . Smoking status: Former Games developer  . Smokeless tobacco: Never Used  Substance Use Topics  . Alcohol use: Not on file   . Drug use: Not on file    No Known Allergies  Current Outpatient Medications  Medication Sig Dispense Refill  . lisinopril (PRINIVIL,ZESTRIL) 20 MG tablet Take 20 mg by mouth daily.    . naproxen (NAPROSYN) 500 MG tablet Take 1 tablet (500 mg total) by mouth 2 (two) times daily with a meal. 60 tablet 5  . simvastatin (ZOCOR) 20 MG tablet Take 20 mg by mouth daily.    . tamsulosin (FLOMAX) 0.4 MG CAPS capsule Take 0.4 mg by mouth daily.    Marland Kitchen HYDROcodone-acetaminophen (NORCO/VICODIN) 5-325 MG tablet One tablet every four hours as needed for acute pain.  Limit of five days per [place] statue. 30 tablet 0  . Insulin Glargine (TOUJEO SOLOSTAR [place]) Inject into the skin.     No current facility-administered medications for this visit.      Physical Exam  There were no vitals taken for this visit.  Constitutional: overall normal hygiene, normal nutrition, well developed, normal grooming, normal body habitus. Assistive device:cane  Musculoskeletal: gait and station Limp left, muscle tone and strength are normal, no tremors or atrophy is present.  .  Neurological: coordination overall normal.  Deep tendon reflex/nerve stretch intact.  Sensation normal.  Cranial nerves II-XII intact.   Skin:   Normal overall no scars, lesions, ulcers or rashes. No psoriasis.  Psychiatric: Alert and oriented x 3.  Recent memory intact, remote memory unclear.  Normal mood  and affect. Well groomed.  Good eye contact.  Cardiovascular: overall no swelling, no varicosities, no edema bilaterally, normal temperatures of the legs and arms, no clubbing, cyanosis and good capillary refill.  Left knee has effusion, crepitus, ROM 0 to 105, medial joint line pain, NV intact, limp left.  Lymphatic: palpation is normal.  All other systems reviewed and are negative   The patient has been educated about the nature of the problem(s) and counseled on treatment options.  The patient appeared to understand what I have  discussed and is in agreement with it.  Encounter Diagnosis  Name Primary?  . Chronic pain of left knee Yes   PROCEDURE NOTE:  The patient requests injections of the left knee , verbal consent was obtained.  The left knee was prepped appropriately after time out was performed.   Sterile technique was observed and injection of 1 cc of Depo-Medrol 40 mg with several cc's of plain xylocaine. Anesthesia was provided by ethyl chloride and a 20-gauge needle was used to inject the knee area. The injection was tolerated well.  A band aid dressing was applied.  The patient was advised to apply ice later today and tomorrow to the injection sight as needed.  PLAN Call if any problems.  Precautions discussed.  Continue current medications.   Return to clinic 2 weeks   I have reviewed the [facility] Controlled Substance Reporting System web site prior to prescribing narcotic medicine for this patient.   Electronically Signed Donald Mclean, MD 3/3/20203:47 PM

## 2018-09-13 ENCOUNTER — Ambulatory Visit: Payer: Medicare Other | Admitting: Orthopaedic Surgery

## 2018-09-20 ENCOUNTER — Telehealth: Payer: Self-pay | Admitting: Radiology

## 2018-09-20 NOTE — Telephone Encounter (Signed)
I called patient, he has no knee pain, and agrees to cancel appointment tomorrow (COVID19) he will let us know if his pain increases.

## 2018-09-21 ENCOUNTER — Ambulatory Visit: Payer: Medicare Other | Admitting: Orthopaedic Surgery

## 2018-09-29 ENCOUNTER — Telehealth: Payer: Self-pay | Admitting: Orthopaedic Surgery

## 2018-09-29 MED ORDER — HYDROCODONE-ACETAMINOPHEN 5-325 MG PO TABS: ORAL_TABLET | ORAL | 0 refills | Status: DC

## 2018-09-29 NOTE — Telephone Encounter (Signed)
Patient called back about left knee pain recurring; had opted to cancel March follow up appointment due to covid-19 restrictions. Spoke with Dr Hilda Lias, will review medication for refill. HYDROcodone-acetaminophen (NORCO/VICODIN) 5-325 MG tablet 30 tablet    - [facility]

## 2018-09-30 NOTE — Telephone Encounter (Signed)
Done as noted per Dr Hilda Lias 09/29/2018; patient aware.

## 2018-11-08 ENCOUNTER — Telehealth: Payer: Self-pay | Admitting: Radiology

## 2018-11-08 NOTE — Telephone Encounter (Signed)
Patient called, LM asking for someone to call him.  I called him back and he wants to know if he can see Dr Hilda Lias or get a refill of meds.  He is out of hyrdocodone.  He is having leg pain still, but does not want to do any surgery/treatment now with the COVID-19 restrictions.  [facility] [place]. Please advise.

## 2018-11-09 ENCOUNTER — Other Ambulatory Visit: Payer: Self-pay | Admitting: Orthopedic Surgery

## 2018-11-09 MED ORDER — HYDROCODONE-ACETAMINOPHEN 5-325 MG PO TABS: ORAL_TABLET | ORAL | 0 refills | Status: DC

## 2018-11-09 NOTE — Telephone Encounter (Signed)
Spoke with patient (also had spoken with Toniann Fail 11/08/18) - requesting refill on medication:  HYDROcodone-acetaminophen (NORCO/VICODIN) 5-325 MG tablet 28 tablet  Pharmacy is [facility], [place]  - No appointments currently scheduled - please advise regarding refill request and office visit (?Virtual)

## 2018-11-09 NOTE — Telephone Encounter (Signed)
Dr Hilda Lias told him in March Return to clinic 2 weeks Okey Regal, please make follow up  To Dr Hilda Lias for med refill request.

## 2018-12-28 ENCOUNTER — Other Ambulatory Visit: Payer: Self-pay

## 2018-12-28 ENCOUNTER — Telehealth: Payer: Self-pay | Admitting: Orthopaedic Surgery

## 2018-12-28 ENCOUNTER — Ambulatory Visit: Payer: Medicare Other | Admitting: Orthopaedic Surgery

## 2018-12-28 NOTE — Telephone Encounter (Signed)
Donald Mclaughlin and Dr. Luna Glasgow could not get together for virtual appointment. Donald Mclaughlin will have his daughter see when she can bring him next week and he will call me back.

## 2019-08-07 ENCOUNTER — Other Ambulatory Visit: Payer: Self-pay

## 2019-08-07 NOTE — Patient Outreach (Signed)
[facility]) Care Management  08/07/2019  Donald Mclaughlin 08/15/1945 [id number]   Medication Adherence call to Donald Mclaughlin HIPPA Compliant Voice message left with a call back number. Donald Mclaughlin is showing past due on Lisinopril 20 mg under [employer].   Lillia Abed CPhT Pharmacy Technician [facility] Care Management Direct Dial [phone]  Fax [phone] [email or web address]

## 2019-08-08 ENCOUNTER — Ambulatory Visit: Payer: Medicare Other | Admitting: Orthopaedic Surgery

## 2019-08-10 DIAGNOSIS — E119 Type 2 diabetes mellitus without complications: Secondary | ICD-10-CM | POA: Diagnosis not present

## 2019-08-10 DIAGNOSIS — I10 Essential (primary) hypertension: Secondary | ICD-10-CM | POA: Diagnosis not present

## 2019-08-15 ENCOUNTER — Other Ambulatory Visit: Payer: Self-pay

## 2019-08-15 ENCOUNTER — Ambulatory Visit (INDEPENDENT_AMBULATORY_CARE_PROVIDER_SITE_OTHER): Payer: Medicare Other | Admitting: Orthopaedic Surgery

## 2019-08-15 ENCOUNTER — Encounter: Payer: Self-pay | Admitting: Orthopaedic Surgery

## 2019-08-15 VITALS — BP 116/72 | HR 76 | Temp 97.7°F | Ht 67.0 in | Wt 209.0 lb

## 2019-08-15 DIAGNOSIS — M25562 Pain in left knee: Secondary | ICD-10-CM | POA: Diagnosis not present

## 2019-08-15 DIAGNOSIS — G8929 Other chronic pain: Secondary | ICD-10-CM

## 2019-08-15 MED ORDER — HYDROCODONE-ACETAMINOPHEN 5-325 MG PO TABS: ORAL_TABLET | ORAL | 0 refills | Status: DC

## 2019-08-15 NOTE — Progress Notes (Signed)
PROCEDURE NOTE:  The patient requests injections of the left knee , verbal consent was obtained.  The left knee was prepped appropriately after time out was performed.   Sterile technique was observed and injection of 1 cc of Depo-Medrol 40 mg with several cc's of plain xylocaine. Anesthesia was provided by ethyl chloride and a 20-gauge needle was used to inject the knee area. The injection was tolerated well.  A band aid dressing was applied.  The patient was advised to apply ice later today and tomorrow to the injection sight as needed.  I will call in pain medicine.  I will schedule MRI of the left knee.  He has ROM of 0 to 105, crepitus, positive medial McMurray, not improving.    Electronically Signed Darreld Mclean, MD 2/16/20213:31 PM

## 2019-08-31 ENCOUNTER — Ambulatory Visit (HOSPITAL_COMMUNITY): Admission: RE | Admit: 2019-08-31 | Payer: Medicare Other | Source: Ambulatory Visit

## 2019-09-05 ENCOUNTER — Ambulatory Visit: Payer: Medicare Other | Admitting: Orthopaedic Surgery

## 2019-09-07 DIAGNOSIS — E119 Type 2 diabetes mellitus without complications: Secondary | ICD-10-CM | POA: Diagnosis not present

## 2019-09-07 DIAGNOSIS — I10 Essential (primary) hypertension: Secondary | ICD-10-CM | POA: Diagnosis not present

## 2019-09-14 ENCOUNTER — Telehealth: Payer: Self-pay | Admitting: Orthopaedic Surgery

## 2019-09-14 MED ORDER — HYDROCODONE-ACETAMINOPHEN 5-325 MG PO TABS: ORAL_TABLET | ORAL | 0 refills | Status: DC

## 2019-09-14 NOTE — Telephone Encounter (Signed)
Patient requests a refill on Hydrocodone/Adcetaminophen 5-325  Mgs.  Qty  30  Sig: One tablet every four hours as needed for acute pain. Limit of five days per [place].  Patient states he uses [facility] in [place]

## 2019-09-27 ENCOUNTER — Other Ambulatory Visit: Payer: Self-pay

## 2019-09-27 ENCOUNTER — Ambulatory Visit (HOSPITAL_COMMUNITY)
Admission: RE | Admit: 2019-09-27 | Discharge: 2019-09-27 | Disposition: A | Discharge status: Home / Self Care | Payer: Medicare Other | Source: Ambulatory Visit | Attending: Orthopaedic Surgery | Admitting: Orthopaedic Surgery

## 2019-09-27 DIAGNOSIS — G8929 Other chronic pain: Secondary | ICD-10-CM

## 2019-09-27 DIAGNOSIS — M25562 Pain in left knee: Secondary | ICD-10-CM | POA: Insufficient documentation

## 2019-09-27 DIAGNOSIS — M23222 Derangement of posterior horn of medial meniscus due to old tear or injury, left knee: Secondary | ICD-10-CM | POA: Diagnosis not present

## 2019-10-03 ENCOUNTER — Ambulatory Visit: Payer: Medicare Other | Admitting: Orthopaedic Surgery

## 2019-10-08 DIAGNOSIS — E785 Hyperlipidemia, unspecified: Secondary | ICD-10-CM | POA: Diagnosis not present

## 2019-10-08 DIAGNOSIS — I10 Essential (primary) hypertension: Secondary | ICD-10-CM | POA: Diagnosis not present

## 2019-10-10 ENCOUNTER — Telehealth: Payer: Self-pay | Admitting: Radiology

## 2019-10-10 NOTE — Telephone Encounter (Signed)
Patient called, LM asking for a call.  Verified appt for this Thursday.

## 2019-10-12 ENCOUNTER — Other Ambulatory Visit: Payer: Self-pay

## 2019-10-12 ENCOUNTER — Telehealth: Payer: Self-pay | Admitting: Orthopedic Surgery

## 2019-10-12 ENCOUNTER — Ambulatory Visit (INDEPENDENT_AMBULATORY_CARE_PROVIDER_SITE_OTHER): Payer: Medicare Other | Admitting: Orthopaedic Surgery

## 2019-10-12 ENCOUNTER — Encounter: Payer: Self-pay | Admitting: Orthopaedic Surgery

## 2019-10-12 VITALS — Ht 67.0 in | Wt 189.0 lb

## 2019-10-12 DIAGNOSIS — M25562 Pain in left knee: Secondary | ICD-10-CM

## 2019-10-12 DIAGNOSIS — G8929 Other chronic pain: Secondary | ICD-10-CM | POA: Diagnosis not present

## 2019-10-12 MED ORDER — HYDROCODONE-ACETAMINOPHEN 5-325 MG PO TABS: ORAL_TABLET | ORAL | 0 refills | Status: DC

## 2019-10-12 NOTE — Progress Notes (Signed)
Patient Donald Mclaughlin, male DOB:Oct 21, 1945, 74 y.o. NWG:[id number]  Chief Complaint  Patient presents with  . Knee Pain    Left knee    HPI  Donald Mclaughlin is a 74 y.o. male who has pain of the left knee with giving way, swelling and popping.  He had MRI which showed  IMPRESSION: 1. Severely degenerated and torn medial meniscus. 2. 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 3. Significant tricompartmental degenerative changes most notable in the medial compartment. 4. Moderate-sized joint effusion, moderate synovitis and findings of lipoma arborescens. There is a 2 cm loose ossified body in the posterior joint space. 5. MCL and pes anserine bursitis.  I have told him of the findings.  I recommended he see Dr. Romeo Apple for arthroscopy of the knee.  He is agreeable to this.  Body mass index is 29.6 kg/m.  ROS  Review of Systems  Constitutional: Positive for activity change.  HENT: Negative for congestion.   Respiratory: Negative for cough and shortness of breath.   Cardiovascular: Negative for chest pain and leg swelling.  Endocrine: Negative for cold intolerance.  Musculoskeletal: Positive for gait problem and joint swelling. Negative for arthralgias.  Allergic/Immunologic: Negative for environmental allergies.  All other systems reviewed and are negative.   All other systems reviewed and are negative.  The following is a summary of the past history medically, past history surgically, known current medicines, social history and family history.  This information is gathered electronically by the computer from prior information and documentation.  I review this each visit and have found including this information at this point in the chart is beneficial and informative.    Past Medical History:  Diagnosis Date  . Arthritis   . Diabetes mellitus without complication (HCC)   . High blood pressure   . Thrombocytopenia (HCC) 12/31/2015    Past Surgical History:  Procedure  Laterality Date  . HIP SURGERY Right 2013   arthroplasty    Family History  Problem Relation Age of Onset  . Hypertension Mother     Social History Social History   Tobacco Use  . Smoking status: Former Games developer  . Smokeless tobacco: Never Used  Substance Use Topics  . Alcohol use: Not on file  . Drug use: Not on file    No Known Allergies  Current Outpatient Medications  Medication Sig Dispense Refill  . HYDROcodone-acetaminophen (NORCO/VICODIN) 5-325 MG tablet One tablet every six hours for pain.  Limit 7 days. 28 tablet 0  . Insulin Glargine (TOUJEO SOLOSTAR [place]) Inject into the skin.    Marland Kitchen lisinopril (PRINIVIL,ZESTRIL) 20 MG tablet Take 20 mg by mouth daily.    . naproxen (NAPROSYN) 500 MG tablet Take 1 tablet (500 mg total) by mouth 2 (two) times daily with a meal. 60 tablet 5  . simvastatin (ZOCOR) 20 MG tablet Take 20 mg by mouth daily.    . tamsulosin (FLOMAX) 0.4 MG CAPS capsule Take 0.4 mg by mouth daily.     No current facility-administered medications for this visit.     Physical Exam  Height 5\' 7"  (1.702 m), weight 189 lb (85.7 kg).  Constitutional: overall normal hygiene, normal nutrition, well developed, normal grooming, normal body habitus. Assistive device:cane  Musculoskeletal: gait and station Limp left, muscle tone and strength are normal, no tremors or atrophy is present.  .  Neurological: coordination overall normal.  Deep tendon reflex/nerve stretch intact.  Sensation normal.  Cranial nerves II-XII intact.   Skin:   Normal  overall no scars, lesions, ulcers or rashes. No psoriasis.  Psychiatric: Alert and oriented x 3.  Recent memory intact, remote memory unclear.  Normal mood and affect. Well groomed.  Good eye contact.  Cardiovascular: overall no swelling, no varicosities, no edema bilaterally, normal temperatures of the legs and arms, no clubbing, cyanosis and good capillary refill.  Lymphatic: palpation is normal.  Left knee with effusion,  crepitus, medial joint line pain, positive medial Mc Murray, ROM 0 to 100 with pain, limp left.  NV intact.  All other systems reviewed and are negative   The patient has been educated about the nature of the problem(s) and counseled on treatment options.  The patient appeared to understand what I have discussed and is in agreement with it.  Encounter Diagnosis  Name Primary?  . Chronic pain of left knee Yes    PLAN Call if any problems.  Precautions discussed.  Continue current medications.   Return to clinic to see Dr. Aline Brochure   Electronically Signed Sanjuana Kava, MD 4/15/20219:45 AM

## 2019-10-12 NOTE — Telephone Encounter (Addendum)
Donald Mclaughlin has transportation issues.  He said on his checkout from the office visit today.  He would have to call us back about setting up an appointment with Dr. Romeo Apple.  I gave him the AVS, an appointment card and a reminder that Dr. Romeo Apple does these consults on Tuesday afternoons.  He understood and said he would have to talk with someone about bringing him in for another appointment.  He will call us back with potential appointment dates.

## 2019-10-22 NOTE — Progress Notes (Signed)
PREOP CONSULT  Chief Complaint  Patient presents with  . Knee Pain    left     74 year old male status post right total hip referred to me by Dr. Hilda Lias who presented to him with chronic pain in his left knee.  He tried some routine nonoperative measures but he did not improve  The patient is here with his daughter who is a Financial trader.  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  They have moved here from [place]   He complains of 8 out of 9 pain that is worse which is located in the back of his knee associated with decreased range of motion trouble getting in and out of a car off the couch.  His pain is worse with cold weather he notices swelling of his knee and he is currently using a cane.  He did take naproxen hydrocodone  He is diabetic has hypertension  Primary care doctor is Dr. Felecia Shelling   Review of Systems  Respiratory: Negative.  Negative for shortness of breath.   Cardiovascular: Negative.  Negative for chest pain.  All other systems reviewed and are negative.    Past Medical History:  Diagnosis Date  . Arthritis   . Diabetes mellitus without complication (HCC)   . High blood pressure   . Thrombocytopenia (HCC) 12/31/2015    Past Surgical History:  Procedure Laterality Date  . HIP SURGERY Right 2013   arthroplasty    Family History  Problem Relation Age of Onset  . Hypertension Mother    Social History   Tobacco Use  . Smoking status: Former Games developer  . Smokeless tobacco: Never Used  Substance Use Topics  . Alcohol use: Not on file  . Drug use: Not on file    No Known Allergies   Current Meds  Medication Sig  . HYDROcodone-acetaminophen (NORCO/VICODIN) 5-325 MG tablet One tablet every six hours for pain.  Limit 7 days.  . Insulin Glargine (TOUJEO SOLOSTAR [place]) Inject into the skin.  Marland Kitchen lisinopril (PRINIVIL,ZESTRIL) 20 MG tablet Take 20 mg by mouth daily.  . naproxen (NAPROSYN) 500 MG tablet Take 1 tablet (500 mg total) by mouth 2 (two) times  daily with a meal.  . simvastatin (ZOCOR) 20 MG tablet Take 20 mg by mouth daily.  . tamsulosin (FLOMAX) 0.4 MG CAPS capsule Take 0.4 mg by mouth daily.    BP 133/88   Pulse 73   Ht 5\' 7"  (1.702 m)   Wt 209 lb (94.8 kg)   BMI 32.73 kg/m   Physical Exam General appearance: Normal development no nutritional abnormalities normal body habitus no gross deformities well groomed  Peripheral pulses are intact with no varicosities or swelling normal temperature without edema  Lymph system normal Ortho Exam He ambulates with a cane he has a varus knee no significant thrust on the left side.  He really has mild tenderness over the femoral condyle medially none laterally tibia is nontender posterior medial joint line is nontender.  He has a slight flexion contracture and he barely reaches 90 degrees of flexion but has a stable knee with normal strength and muscle tone good quadriceps function is noted.  The skin is clean  Sensations intact he is oriented x3 mood is pleasant   MEDICAL DECISION SECTION  xrays ordered?  Axial patella view shows patellofemoral arthritis with normal tracking  My independent reading of xrays: X-rays which were previously done in our office show a varus knee with severe loss of  joint space medially subchondral sclerosis osteophytes posteriorly medially and anteriorly  MRI was also obtained which showed IMPRESSION: 1. Severely degenerated and torn medial meniscus. 2. 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 3. Significant tricompartmental degenerative changes most notable in the medial compartment. 4. Moderate-sized joint effusion, moderate synovitis and findings of lipoma arborescens. There is a 2 cm loose ossified body in the posterior joint space. 5. MCL and pes anserine bursitis.   Electronically Signed   By: Marijo Sanes M.D.   On: 09/28/2019 05:40  Encounter Diagnosis  Name Primary?  . Chronic pain of left knee Yes     PLAN:    Primary care doctor: Dr. Legrand Rams  Preop assessments:  Social setting, 2 stairs to get in the house no stairs in house  Daughter will be with him after surgery Risk of surgery: Bleeding infection risk of amputation DVT pulmonary embolus stiffness continued postop pain  Alternative treatments: Continue with nonoperative care such as but not limited to Tylenol anti-inflammatories tramadol topical medications bracing chronic pain management  Patient factors that increase risk: Diabetes, history of thrombocytopenia, A1c pending with preop assessment by Dr. Legrand Rams also pending  Pain management: Medication will be tapered off at 6 weeks an alternative measures will be needed to control pain  Postoperatively: Physical therapy will be set up, you have a CPM machine, you have an ice cuff, you will have DVT prevention, you will wear stockings for 4 weeks.  Bedside commode, shower chair, 3 in 1 chair.   Surgical procedure planned: Left total knee arthroplasty  The procedure has been fully reviewed with the patient; The risks and benefits of surgery have been discussed and explained and understood. Alternative treatment has also been reviewed, questions were encouraged and answered. The postoperative plan is also been reviewed.  Nonsurgical treatment as described in the history and physical section was attempted and unsuccessful and the patient has agreed to proceed with surgical intervention to improve their situation.  No orders of the defined types were placed in this encounter.   Arther Abbott, MD 10/24/2019 4:22 PM

## 2019-10-24 ENCOUNTER — Encounter: Payer: Self-pay | Admitting: Orthopedic Surgery

## 2019-10-24 ENCOUNTER — Other Ambulatory Visit: Payer: Self-pay

## 2019-10-24 ENCOUNTER — Ambulatory Visit (INDEPENDENT_AMBULATORY_CARE_PROVIDER_SITE_OTHER): Payer: Medicare Other | Admitting: Orthopedic Surgery

## 2019-10-24 ENCOUNTER — Ambulatory Visit: Payer: Medicare Other

## 2019-10-24 VITALS — BP 133/88 | HR 73 | Ht 67.0 in | Wt 209.0 lb

## 2019-10-24 DIAGNOSIS — G8929 Other chronic pain: Secondary | ICD-10-CM

## 2019-10-24 DIAGNOSIS — M25562 Pain in left knee: Secondary | ICD-10-CM | POA: Diagnosis not present

## 2019-10-24 NOTE — Patient Instructions (Addendum)
Call the office after you have seen Dr. Felecia Shelling so that we can schedule your knee replacement   Preparing for Knee Replacement Getting prepared before knee replacement surgery can make your recovery easier and more comfortable. This document provides some tips and guidelines that will help you prepare for your surgery. Talk with your health care provider so you can learn what to expect before, during, and after surgery. Ask questions if you do not understand something. To ease concerns about your financial responsibilities, call your insurance company as soon as you decide to have surgery. Ask how much of your surgery and hospital stay will be covered. Also ask about coverage for medical equipment, rehabilitation facilities, and home care. How should I arrange for help? In the first couple weeks after surgery, it will likely be harder for you to do some of your regular activities. You may get tired easily, and you will have limited movement in your leg. Follow these guidelines to make sure you have all the help you need after your surgery:  Plan to have someone take you home from the hospital. Your health care provider will tell you how many days you can expect to be in the hospital.  Cancel all your work, caregiving, and volunteer responsibilities for at least 4-6 weeks after your surgery.  Plan to have someone stay with you day and night for the first week. This person should be someone you are comfortable with. You may need this person to help you with your exercises and personal care, such as bathing and using the toilet.  If you live alone, arrange for someone to take care of your home and pets for the first 4-6 weeks after surgery.  Arrange for drivers to take you to and from follow-up appointments, the grocery store, and other places you may need to go for at least 4-6 weeks.  Consider applying for a disabled parking permit. To get an application, contact the Department of Motor Vehicles or  your health care provider's office. How should I prepare my home?      Pick a recovery spot, but do not plan on recovering in bed. Sitting upright is better for your health. You may want to use a recliner with a small table nearby. Place the items you use most frequently on that table. These may include the TV remote, a cordless phone, a cell phone, a book or laptop computer, and a water glass.  To see if you will be able to move around in your home with a walker, hold your hands out about 6 inches (15 cm) from your sides, and walk from your recovery spot to your kitchen and bathroom. Then walk from your bed to the bathroom. If you do not hit anything with your hands, you will have enough room for a walker.  Minimize the use of stairs after you return home to reduce your risk of falling or tripping.  Remove all clutter from your floors. Also remove any throw rugs. This will help you avoid tripping after your surgery.  Move the items you use most often in your kitchen, bathroom, and bedroom to shelves and drawers that are at countertop height.  Prepare a few meals to freeze and reheat later.  Consider getting safety equipment that will be helpful during your recovery, such as: ? Grab bars added in the shower and near the toilet. ? A raised toilet seat to help you get on and off the toilet more easily. ? A tub or  shower bench. How should I prepare my body?  Have a preoperative exam. ? During the exam, your health care provider will make sure that your body is healthy enough to safely have the surgery. ? When you go to the exam, bring a complete list of all your medicines and supplements, including herbs and vitamins. ? You may need to have additional tests to ensure your safety.  Have elective dental care and routine cleanings done before your surgery. Germs from anywhere in your body, including your mouth, can travel to your new joint and infect it. It is important that you do not have  any dental work done for at least 3 months after your surgery.  Maintain a healthy diet. Do not change your diet before surgery unless your health care provider tells you to do that.  Do not use any products that contain nicotine or tobacco, such as cigarettes and e-cigarettes. These can delay bone healing after surgery. If you need help quitting, ask your health care provider.  Tell your health care provider if: ? You develop any skin infections or skin irritations. You may need to improve the condition of your skin before surgery. ? You have a fever, a cold, or any other illness in the week before your surgery.  Do not drink any alcohol for at least 48 hours before surgery.  The day before your surgery, follow instructions from your health care provider about showering, eating, drinking, and taking medicines. These directions are for your safety.  Talk to your health care provider about doing exercises before your surgery. ? Be sure to follow the exercise program only as directed by your health care provider. ? Doing these exercises in the weeks before your surgery may help reduce pain and improve function after surgery. Summary  Getting prepared before knee replacement surgery can make your recovery easier and more comfortable.  Prepare your home and arrange for help at home.  Keep all of your preoperative appointments to ensure that you are ready for your surgery.  Plan to have someone take you home from the hospital and stay with you day and night for the first week. This information is not intended to replace advice given to you by your health care provider. Make sure you discuss any questions you have with your health care provider. Document Revised: 08/02/2017 Document Reviewed: 08/02/2017 Elsevier Patient Education  2020 [place].  Total Knee Replacement  Total knee replacement is a procedure to replace the damaged knee joint with an artificial (prosthetic) knee joint.  The purpose of this surgery is to reduce knee pain and improve knee function. The prosthetic knee joint (prosthesis) may be made of metal, plastic, or ceramic. It replaces parts of the thigh bone (femur), lower leg bone (tibia), and kneecap (patella) that are removed during the procedure. Tell a health care provider about:  Any allergies you have.  All medicines you are taking, including vitamins, herbs, eye drops, creams, and over-the-counter medicines.  Any problems you or family members have had with anesthetic medicines.  Any blood disorders you have.  Any surgeries you have had.  Any medical conditions you have.  Whether you are pregnant or may be pregnant. What are the risks? Generally, this is a safe procedure. However, problems may occur, including:  Infection.  Bleeding.  Blood clot.  Allergic reactions to medicines.  Damage to nerves or other structures.  Decreased range of motion of the knee.  Instability of the knee.  Loosening of the  prosthetic joint.  Knee pain that does not go away (chronic pain). What happens before the procedure? Staying hydrated Follow instructions from your health care provider about hydration, which may include:  Up to 2 hours before the procedure - you may continue to drink clear liquids, such as water, clear fruit juice, black coffee, and plain tea.  Eating and drinking restrictions Follow instructions from your health care provider about eating and drinking, which may include:  8 hours before the procedure - stop eating heavy meals or foods, such as meat, fried foods, or fatty foods.  6 hours before the procedure - stop eating light meals or foods, such as toast or cereal.  6 hours before the procedure - stop drinking milk or drinks that contain milk.  2 hours before the procedure - stop drinking clear liquids. Medicines Ask your health care provider about:  Changing or stopping your regular medicines. This is especially  important if you are taking diabetes medicines or blood thinners.  Taking medicines such as aspirin and ibuprofen. These medicines can thin your blood. Do not take these medicines unless your health care provider tells you to take them.  Taking over-the-counter medicines, vitamins, herbs, and supplements. Tests and exams  You may have a physical exam.  You may have tests, such as: ? X-rays. ? MRI. ? CT scan. ? Bone scans.  You may have a blood or urine sample taken. Lifestyle   If your health care provider prescribes physical therapy, do exercises as instructed.  Maintain good body and oral hygiene. Germs from anywhere in your body can travel to your new joint and infect it. Tell your health care provider if you: ? Plan to have dental care and routine cleanings. ? Develop any skin infections.  If you are overweight, work with your health care provider to reach a safe weight. Extra weight can put pressure on your knee.  Do not use any products that contain nicotine or tobacco, such as cigarettes, e-cigarettes, and chewing tobacco. These can delay healing after surgery. If you need help quitting, ask your health care provider. General instructions  Plan to have someone take you home from the hospital or clinic.  Plan to have a responsible adult care for you for at least 24 hours after you leave the hospital or clinic. This is important. It is recommended that you have someone to help care for you for at least 4-6 weeks after your procedure.  Do not shave your legs just before surgery. If hair removal is needed, it will be done in the hospital.  Ask your health care provider how your surgical site will be marked or identified.  Ask your health care provider what steps will be taken to prevent infection. These may include: ? Removing hair at the surgery site. ? Washing skin with a germ-killing soap. What happens during the procedure?  An IV will be inserted into one of your  veins.  You will be given one or more of the following: ? A medicine to help you relax (sedative). ? A medicine that is injected into an area of your body near the nerves to numb everything below the injection site (peripheral nerve block). ? A medicine that is injected into your spine to numb the area below and slightly above the injection site (spinal anesthetic). ? A medicine to make you fall asleep (general anesthetic).  An incision will be made in your knee.  Damaged cartilage and bone will be removed from your femur,  tibia, and patella.  Parts of the prosthesis (liners) will be placed over the areas of bone and cartilage that were removed. A metal liner will be placed over your femur, and plastic liners will be placed over your tibia and the underside of your patella.  One or more small tubes (drains) may be placed near your incision to help drain extra fluid from your surgery site.  Your incision will be closed with stitches (sutures), skin glue, or adhesive strips.  A bandage (dressing) will be placed over your incision. The procedure may vary among health care providers and hospitals. What happens after the procedure?  Your blood pressure, heart rate, breathing rate, and blood oxygen level will be monitored until you leave the hospital or clinic.  You will be given medicines to help manage pain.  You may: ? Continue to receive fluids through an IV. ? Have fluid coming from one or more drains in your incision. ? Have to wear compression stockings. These stockings help to prevent blood clots and reduce swelling in your legs. ? Be given a continuous passive motion machine to use. You will be shown how to use this machine.  You will be encouraged to move. A physical therapist will teach you how to use crutches or a walker. He or she will also teach you how to exercise at home.  Do not drive until your health care provider approves. Summary  Total knee replacement is a  procedure to replace the knee joint with an artificial (prosthetic) knee joint.  Before the procedure, follow instructions from your health care provider about eating and drinking.  Plan to have someone take you home from the hospital or clinic. This information is not intended to replace advice given to you by your health care provider. Make sure you discuss any questions you have with your health care provider. Document Revised: 01/27/2018 Document Reviewed: 01/27/2018 Elsevier Patient Education  2020 [employer]  Opioid Pain Medicine Management Opioids are powerful medicines that are used to treat moderate to severe pain. When used for short periods of time, they can help you:  Sleep better.  Do better in physical or occupational therapy.  Feel better in the first few days after an injury.  Recover from surgery. Opioids should be taken with the supervision of a trained health care provider. They should be taken for the shortest period of time possible. This is because opioids can be addictive, and the longer you take opioids, the greater your risk of addiction (opioid use disorder). What are the risks? Using opioid pain medicines for longer than 3 days increases your risk of these side effects. Opioids can cause side effects, such as:  Constipation.  Nausea.  Vomiting.  Drowsiness.  Confusion.  Opioid use disorder.  Breathing difficulties (respiratory depression). Taking opioid pain medicine for a long period of time can affect your ability to do daily tasks. It also puts you at risk for:  Motor vehicle accidents.  Depression.  Suicide.  Heart attack.  Overdose, which can sometimes lead to death. What is a pain treatment plan? A pain treatment plan is an agreement between you and your health care provider. Pain is unique to each person, and treatments vary depending on your condition. To manage your pain successfully, you and your health care provider need to  understand each other and work together. To help you do this:  Discuss the goals of your treatment, including how much pain you might expect to have and how you  will manage the pain.  Review the risks and benefits of taking opioid medicines for your condition.  Remember that a good treatment plan uses more than one approach and minimizes the chance of side effects.  Be honest about the amount of medicines you take and about any drug or alcohol use.  Get pain medicine prescriptions from only one health care provider. Pain can be managed with many types of alternative treatments. Ask your health care provider to refer you to one or more specialists who can help you manage pain through:  Physical or occupational therapy.  Counseling (cognitive behavioral therapy).  Good nutrition.  Biofeedback.  Massage.  Meditation.  Non-opioid medicine.  Following a gentle exercise program. Tapering your use of opioids If you have been taking opioid medicine for more than a few weeks, you may need to slowly decrease (taper) how much you take until you stop completely. Tapering your use of opioids can decrease your chances of experiencing withdrawal symptoms, such as:  Pain and cramping in the abdomen.  Nausea.  Sweating.  Sleepiness.  Restlessness.  Uncontrollable shaking (tremors).  Cravings for the medicine. Do not attempt to taper your use of opioids on your own. Talk with your health care provider about how to do this. Your health care provider may prescribe a step-down schedule based on how much medicine you are taking and how long you have been taking it. Follow these instructions at home: Safety and storage   While you are taking opioid pain medicine: ? Do not drive. ? Do not use machinery or power tools. ? Do not sign legal documents. ? Do not drink alcohol. ? Do not take sleeping pills. ? Do not supervise children by yourself. ? Do not participate in activities that  require climbing or being in high places. ? Do not enter a body of water, such as a lake, river, ocean, spa, or swimming pool.  Keep pain medicine in a locked cabinet or in a secure area where children cannot reach it.  Do not share your pain medicine with anyone. Getting rid of leftover pills  Do not save any leftover pills. Get rid of leftover pills safely by: ? Taking the medicine to a prescription take-back program. This is usually offered by the county or law enforcement. ? Bringing it to a pharmacy that has a drug disposal container. ? Throwing it out in the trash. Check the label or package insert of your medicine to see whether this is safe to do. If it is safe to throw it out, remove the medicine from the container and mix it with food or pet waste before putting it in the trash. Activity  Return to your normal activities as told by your health care provider. Ask your health care provider what activities are safe for you.  Avoid activities that make your pain worse.  Do exercises as told by your health care provider. General instructions  You may need to take these actions to prevent or treat constipation: ? Drink enough fluid to keep your urine pale yellow. ? Take over-the-counter or prescription medicines. ? Eat foods that are high in fiber, such as beans, whole grains, and fresh fruits and vegetables. ? Limit foods that are high in fat and processed sugars, such as fried or sweet foods.  Keep all follow-up visits as told by your health care provider. This is important. Where to find support If you have been taking opioids for a long time, you may benefit from receiving  support for quitting from a local support group or counselor. Ask your health care provider for a referral to these resources in your area. Where to find more information Centers for Disease Control and Prevention (CDC): [employer] Get help right away if: Seek medical care right away if you are taking  opioids and you, or people close to you, notice any of the following:  Difficulty breathing.  Breathing that is slower or more shallow than normal.  A very slow heartbeat (pulse).  Severe confusion.  Unconsciousness.  Sleepiness.  Slurred speech.  Nausea and vomiting.  Cold, clammy skin.  Blue lips or fingernails.  Limpness.  Abnormally small pupils. If you think that you or someone else may have taken too much of an opioid medicine, get medical help right away. Call your local emergency services (911 in the [place] Do not drive yourself to the hospital. If you ever feel like you may hurt yourself or others, or have thoughts about taking your own life, get help right away. You can go to your nearest emergency department or call:  Your local emergency services (911 in the [place]  The hotline of the [facility] (860) 568-8069 in the [place]  A suicide crisis helpline, such as the National Suicide Prevention Lifeline at (207)401-9485. This is open 24 hours a day. Summary  Opioid medicines can help you manage moderate to severe pain for a short period of time.  A pain treatment plan is an agreement between you and your health care provider. Discuss the goals of your treatment, including how much pain you might expect to have and how you will manage the pain.  Pain can be managed with many types of alternative treatments.  If you think that you or someone else may have taken too much of an opioid, get medical help right away. This information is not intended to replace advice given to you by your health care provider. Make sure you discuss any questions you have with your health care provider. Document Revised: 07/15/2018 Document Reviewed: 07/15/2018 Elsevier Patient Education  2020 [employer].

## 2019-11-03 DIAGNOSIS — E119 Type 2 diabetes mellitus without complications: Secondary | ICD-10-CM | POA: Diagnosis not present

## 2019-11-03 DIAGNOSIS — Z79899 Other long term (current) drug therapy: Secondary | ICD-10-CM | POA: Diagnosis not present

## 2019-11-03 DIAGNOSIS — I10 Essential (primary) hypertension: Secondary | ICD-10-CM | POA: Diagnosis not present

## 2019-11-03 DIAGNOSIS — E785 Hyperlipidemia, unspecified: Secondary | ICD-10-CM | POA: Diagnosis not present

## 2019-11-10 ENCOUNTER — Telehealth: Payer: Self-pay | Admitting: Orthopedic Surgery

## 2019-11-10 NOTE — Telephone Encounter (Signed)
Mr. Donald Mclaughlin wanted to let us know that he sees his PCP, Dr. Felecia Shelling on Tuesday, May 18th.  He will speak to him at that time about Dr. Romeo Apple doing surgery on his knee.  He said he will have Dr. Felecia Shelling let us know if okay to go ahead with surgery.

## 2019-11-14 ENCOUNTER — Telehealth: Payer: Self-pay

## 2019-11-14 DIAGNOSIS — E1165 Type 2 diabetes mellitus with hyperglycemia: Secondary | ICD-10-CM | POA: Diagnosis not present

## 2019-11-14 DIAGNOSIS — Z1389 Encounter for screening for other disorder: Secondary | ICD-10-CM | POA: Diagnosis not present

## 2019-11-14 DIAGNOSIS — Z0001 Encounter for general adult medical examination with abnormal findings: Secondary | ICD-10-CM | POA: Diagnosis not present

## 2019-11-14 DIAGNOSIS — I10 Essential (primary) hypertension: Secondary | ICD-10-CM | POA: Diagnosis not present

## 2019-11-14 NOTE — Telephone Encounter (Signed)
I have sent Dr Felecia Shelling a letter

## 2019-11-14 NOTE — Telephone Encounter (Signed)
Patient called stating that Dr. Felecia Shelling told him for our office to call him to get the ok for surgery. I tried to explain to the patient that we need documentation from Dr. Felecia Shelling verifying this so it can be scanned into his chart.Patient got to raising his voice to me and said"I told you what Dr. Felecia Shelling said, so that is what you need to do". I asked him to not holler at me and that I would let Dr. Romeo Apple know.  Please call and speak with the patient.

## 2019-11-20 ENCOUNTER — Telehealth: Payer: Self-pay | Admitting: Orthopedic Surgery

## 2019-11-20 NOTE — Telephone Encounter (Signed)
Re-faxed letter of clearance to patient's primary care, Dr Felecia Shelling, per Vikki Ports Fax#[phone] - states they've not received. Patient aware it is being re-faxed with last office note.

## 2019-11-21 ENCOUNTER — Telehealth: Payer: Self-pay | Admitting: Orthopedic Surgery

## 2019-11-21 MED ORDER — HYDROCODONE-ACETAMINOPHEN 5-325 MG PO TABS: ORAL_TABLET | ORAL | 0 refills | Status: DC

## 2019-11-21 NOTE — Telephone Encounter (Signed)
Patient called for refill  HYDROcodone-acetaminophen (NORCO/VICODIN) 5-325 MG tablet 28 tablet  -[facility], [facility]

## 2019-11-22 ENCOUNTER — Telehealth: Payer: Self-pay | Admitting: Radiology

## 2019-11-22 NOTE — Telephone Encounter (Signed)
Patient has A1c of 6.6 and is low risk for knee replacement Per letter received from Dr Felecia Shelling.   I will call patient to schedule for left total knee replacement.   Copy of letter placed by your computer, I have a copy in my pink folder, in case anesthesia needs a copy.  Thanks

## 2019-11-23 NOTE — Telephone Encounter (Signed)
Offered June 15th, he will check with daughter and let me know

## 2019-11-24 ENCOUNTER — Encounter: Payer: Self-pay | Admitting: Orthopedic Surgery

## 2019-11-24 ENCOUNTER — Other Ambulatory Visit: Payer: Self-pay | Admitting: Orthopedic Surgery

## 2019-11-24 NOTE — Telephone Encounter (Signed)
Patient called back after checking with his daughter; relays that June 15th is fine for his surgery date. Please advise.

## 2019-11-24 NOTE — Telephone Encounter (Signed)
THanks that is good, placed orders, hospital will take care of the rest

## 2019-11-29 NOTE — Patient Instructions (Signed)
Your procedure is scheduled on: 12/12/2019  Report to [facility] at 6:15    AM.  Call this number if you have problems the morning of surgery: [phone]   Remember:   Do not Eat or Drink after midnight         No Smoking the morning of surgery  :  Take these medicines the morning of surgery with A SIP OF WATER: none              No diabetic medication am of surgery   Do not wear jewelry, make-up or nail polish.  Do not wear lotions, powders, or perfumes. You may wear deodorant.  Do not shave 48 hours prior to surgery. Men may shave face and neck.  Do not bring valuables to the hospital.  Contacts, dentures or bridgework may not be worn into surgery.  Leave suitcase in the car. After surgery it may be brought to your room.  For patients admitted to the hospital, checkout time is 11:00 AM the day of discharge.   Patients discharged the day of surgery will not be allowed to drive home.    Special Instructions: Shower using CHG night before surgery and shower the day of surgery use CHG.  Use special wash - you have one bottle of CHG for all showers.  You should use approximately 1/2 of the bottle for each shower.  Total Knee Replacement  Total knee replacement is a surgery to replace a bad knee joint with a man-made (prosthetic) joint. The man-made joint is called a prosthesis. This joint may be made of plastic, metal, or ceramic parts. It replaces parts of the thigh bone (femur), lower leg bone (tibia), and kneecap (patella). This is done to reduce pain and help you move better. Tell your doctor about:  Any allergies you have.  All medicines you are taking. This includes vitamins, herbs, eye drops, creams, and over-the-counter medicines.  Any problems you or family members have had with anesthetic medicines.  Any blood disorders you have.  Any surgeries you have had.  Any medical conditions you have.  Whether you are pregnant or may be pregnant. What are the  risks? Generally, this is a safe procedure. However, problems may occur, such as:  Infection.  Bleeding.  Blood clot.  Allergic reaction to medicines.  Damage to nerves and other parts of the knee.  Not being able to move your knee as much.  A feeling that the knee is weak or unstable.  Loosening of the new joint.  Pain that does not go away (chronic pain). What happens before the procedure? Staying hydrated Follow instructions from your doctor about hydration. These may include:  Up to 2 hours before the procedure - you may continue to drink clear liquids. These include water, clear fruit juice, black coffee, and plain tea.  Eating and drinking Follow instructions from your doctor about eating and drinking. These may include:  8 hours before the procedure - stop eating heavy meals or foods. These include meat, fried foods, or fatty foods.  6 hours before the procedure - stop eating light meals or foods. These include toast or cereal.  6 hours before the procedure - stop drinking milk or drinks that contain milk.  2 hours before the procedure - stop drinking clear liquids. Medicines Ask your doctor about:  Changing or stopping your normal medicines. This is important.  Taking aspirin and ibuprofen. Do not take these medicines unless your doctor tells you to take  them.  Taking over-the-counter medicines, vitamins, herbs, and supplements. Tests and exams  You may have a physical exam.  You may have tests, such as: ? X-rays. ? MRI. ? CT scan. ? Bone scans.  You may have a blood or urine sample taken. Lifestyle   If your doctor tells you to do physical therapy, do exercises as told.  Keep your body and teeth clean. Germs from anywhere in your body can infect your new joint. Tell your doctor if: ? You plan to have dental care and routine cleanings. ? You get any skin infections.  If you are overweight, work with your doctor to reach a safe weight. Extra  weight can affect your knee.  Do not use any products that contain nicotine or tobacco, such as cigarettes, e-cigarettes, and chewing tobacco. These can delay healing. If you need help quitting, ask your doctor. General instructions  Plan to have someone take you home from the hospital or clinic.  If you will be going home right after the procedure, plan to have someone with you for at least 24 hours. It is best to have someone to help care for you for at least 4-6 weeks after surgery.  Do not shave your legs just before surgery. This will be done in the hospital if needed.  Ask your doctor: ? How your surgery site will be marked. ? What steps will be taken to help prevent the spread of germs. These may include:  Removing hair at the surgery site.  Washing skin with a germ-killing soap. What happens during the procedure?  An IV tube will be put into one of your veins.  You will be given one or more of the following: ? A medicine to help you relax (sedative). ? A medicine to numb everything below the injection site (peripheral nerve block). ? A medicine that numbs your body below the waist (spinal anesthetic). ? A medicine to make you fall asleep (general anesthetic).  A cut (incision) will be made in your knee.  Damaged parts of your thigh bone, lower leg bone, and kneecap will be removed.  Parts of the new joint will be placed on your thigh bone, your lower leg bone, and the underside of your kneecap.  One or more small tubes (drains) may be placed near your cut. This will help to drain fluid.  Your cut will be closed. Your doctor will use stitches (sutures), skin glue, or skin tape (adhesive) strips.  A bandage (dressing) will be placed over your cut. The procedure may vary among doctors and hospitals. What happens after the procedure?  You will be monitored until you leave the hospital or clinic. Your blood pressure, heart rate, breathing rate, and blood oxygen level will  be checked.  You will be given medicines for pain.  You may: ? Keep getting fluids through an IV tube. ? Have fluid coming from the drain. ? Have to wear special socks (compression stockings). ? Be given a knee joint motion machine (continuous passive motion machine) to use at home.  You will be told to move around as much as you can.  Do not drive until your health care provider tells you it is okay. Summary  Total knee replacement is a surgery to replace a bad knee joint with a man-made joint.  Before the procedure, follow what you are told about eating, drinking, and taking medicines.  Plan to have someone take you home from the hospital or clinic. This information is not  intended to replace advice given to you by your health care provider. Make sure you discuss any questions you have with your health care provider. Document Revised: 01/27/2018 Document Reviewed: 01/27/2018 Elsevier Patient Education  2020 [employer]  Total Knee Replacement, Care After This sheet gives you information about how to care for yourself after your procedure. Your doctor may also give you more specific instructions. If you have problems or questions, contact your doctor. What can I expect after the procedure? After the procedure, it is common to have:  Pain.  Swelling.  A small amount of blood coming from your cut from surgery (incision).  Clear fluid coming from your cut from surgery.  Limited movement of your knee. Follow these instructions at home: Medicines  Take over-the-counter and prescription medicines only as told by your doctor.  If you were prescribed a blood thinner (anticoagulant), take it as told by your doctor.  Ask your doctor if the medicine prescribed to you: ? Requires you to avoid driving or using heavy machinery. ? Can cause trouble pooping (constipation). You may need to take steps to prevent or treat trouble pooping:  Drink enough fluid to keep your pee (urine)  pale yellow.  Take over-the-counter or prescription medicines.  Eat foods that are high in fiber. These include beans, whole grains, and fresh fruits and vegetables.  Limit foods that are high in fat and sugar. These include fried or sweet foods. Bathing  Do not take baths, swim, or use a hot tub until your doctor approves. Ask your doctor if you may take showers. You may only be allowed to take sponge baths.  Keep your bandage (dressing) dry until your doctor says it can be taken off. Incision care and drain care   Follow instructions from your doctor about how to take care of your cut from surgery. Make sure you: ? Wash your hands with soap and water before and after you change your bandage. If you cannot use soap and water, use hand sanitizer. ? Change your bandage as told by your doctor. ? Leave stitches (sutures), skin glue, or skin tape (adhesive) strips in place. They may need to stay in place for 2 weeks or longer. If tape strips get loose and curl up, you may trim the loose edges. Do not remove tape strips completely unless your doctor says it is okay.  Check your cut from surgery and your drain site every day for signs of infection. Check for: ? More redness, swelling, or pain. ? More fluid or blood. ? Warmth. ? Pus or a bad smell.  If you have a drain, follow instructions from your doctor about caring for it. Managing pain, stiffness, and swelling      If told, put ice on your knee. ? Put ice in a plastic bag or use the icing device (cold flow pad or cryocuff) that you were given. Follow your doctor's directions about how to use the icing device. ? Place a towel between your skin and the bag or between your skin and the icing device. ? Leave the ice on for 20 minutes, 2-3 times per day.  If told, put heat on your knee before you exercise. Use the heat source that your doctor recommends, such as a moist heat pack or a heating pad. ? Place a towel between your skin and  the heat source. ? Leave the heat on for 20-30 minutes. ? Remove the heat if your skin turns bright red. This is very important  if you are unable to feel pain, heat, or cold. You may have a greater risk of getting burned.  Move your toes often.  Raise (elevate) your knee above the level of your heart while you are sitting or lying down. ? Use several pillows to keep your leg straight. ? Do not put a pillow just under the knee. If the knee is bent for a long time, this may make the knee stiff.  Wear elastic knee support as told by your doctor. Activity  Rest as told by your doctor.  Do not sit for a long time without moving. Get up to take short walks every 1-2 hours. This is important. Ask for help if you feel weak or unsteady.  Ask your doctor what activities are safe for you.  Avoid activities that put stress on your knees. These include running, jumping rope, and jumping jacks.  Do not play contact sports until your doctor says it is okay.  Do exercises as told by your physical therapist.  If you have been sent home with a knee joint motion machine (continuous passive motion machine), use it as told by your doctor. Safety   Do not use your leg to support your body weight until your doctor says that you can. Use crutches or a walker as told by your doctor.  Do not drive until your doctor says it is okay. Ask your doctor when it is safe to drive. General instructions  Do not use any products that contain nicotine or tobacco, such as cigarettes, e-cigarettes, and chewing tobacco. These can delay healing. If you need help quitting, ask your doctor.  Wear special socks (compression stockings) as told by your doctor.  Tell your doctor if you plan to have dental work. Also: ? Tell your dentist about your joint replacement. ? Ask your doctor if there are instructions you need to follow before dental care and routine cleanings.  Keep all follow-up visits as told by your doctor.  This is important. Contact a doctor if:  You have more redness, swelling, or pain around your cut from surgery or your drain.  You have more fluid or blood coming from your cut from surgery or your drain.  You have pus or a bad smell coming from your cut from surgery or your drain.  Your cut from surgery or your drain area feels warm to the touch.  You have a fever.  Your cut breaks open.  You have knee pain that does not go away.  The movement of your knee is getting worse.  Your new joint feels loose. Get help right away if you have:  Pain in your calf or thigh.  Swelling in your calf or thigh.  Shortness of breath.  Trouble breathing.  Chest pain. Summary  After the procedure, it is common to have pain and swelling, blood or fluid coming from your cut from surgery, and trouble moving your knee.  Follow instructions from your doctor about how to take care of your cut from surgery.  Use crutches or a walker as told by your doctor.  If you were prescribed a blood thinner, take it as told by your doctor.  Keep all follow-up visits as told by your doctor. This is important. This information is not intended to replace advice given to you by your health care provider. Make sure you discuss any questions you have with your health care provider. Document Revised: 10/24/2018 Document Reviewed: 01/27/2018 Elsevier Patient Education  2020 [employer].  Spinal Anesthesia and Epidural Anesthesia, Care After These instructions provide you with information about caring for yourself after your procedure. Your health care provider may also give you more specific instructions. Your treatment has been planned according to current medical practices, but problems sometimes occur. Call your health care provider if you have any problems or questions after your procedure. What can I expect after the procedure? After the procedure, it is common to  have:  Sleepiness.  Nausea.  Vomiting.  Numbness or tingling in your legs.  Trouble urinating.  Itching. Follow these instructions at home: For at least 24 hours after the procedure:   Have a responsible adult stay with you. It is important to have someone help care for you until you are awake and alert.  Rest as needed.  Do not: ? Participate in activities in which you could fall or become injured. ? Drive. ? Use heavy machinery. ? Drink alcohol. ? Take sleeping pills or medicines that cause drowsiness. ? Make important decisions or sign legal documents. ? Take care of children on your own. Eating and drinking  If you vomit, drink water, juice, or soup when you can drink without vomiting.  Drink enough fluid to keep your urine pale yellow.  Make sure you have little or no nausea before eating solid foods.  Follow the diet recommended by your health care provider. General instructions  Return to your normal activities as told by your health care provider. Ask your health care provider what activities are safe for you.  Take over-the-counter and prescription medicines only as told by your health care provider.  If you have sleep apnea, surgery and certain medicines can increase your risk for breathing problems. Follow instructions from your health care provider about wearing your sleep device: ? Anytime you are sleeping, including during daytime naps. ? While taking prescription pain medicines, sleeping medicines, or medicines that make you drowsy.  Do not use any products that contain nicotine or tobacco, such as cigarettes and e-cigarettes. If you need help quitting, ask your health care provider. If you smoke, do not smoke without supervision.  Keep all follow-up visits as told by your health care provider. This is important. Contact a health care provider if:  You have nausea and vomiting that continue the day after anesthetic medicine was given.  You develop  a rash. Get help right away if:  You have a fever.  You have a persistent or severe headache.  You develop blurred or double vision.  You develop dizziness or feel light-headed.  You faint.  You have weakness, numbness, or tingling in your arms or legs.  You have difficulty breathing.  You are unable to pass urine. Summary  After your procedure, it is common to have sleepiness, nausea, vomiting, and trouble passing urine.  For at least 24 hours after the procedure, do not do things that will make you more likely to get hurt. Also, do not drive, use heavy machinery, or make important decisions.  Do not eat solid foods until you have no nausea and no vomiting. Do not use products that contain nicotine or tobacco.  Get help if you have a fever, persistent headache, blurred or double vision, weakness or numbness in arms and legs, or difficulty breathing or passing urine. This information is not intended to replace advice given to you by your health care provider. Make sure you discuss any questions you have with your health care provider. Document Revised: 12/05/2018 Document Reviewed: 10/07/2015 Elsevier Patient  Education  2020 Elsevier Inc.  

## 2019-12-04 ENCOUNTER — Other Ambulatory Visit: Payer: Self-pay | Admitting: Orthopedic Surgery

## 2019-12-04 DIAGNOSIS — M25562 Pain in left knee: Secondary | ICD-10-CM

## 2019-12-04 DIAGNOSIS — G8929 Other chronic pain: Secondary | ICD-10-CM

## 2019-12-05 ENCOUNTER — Encounter (HOSPITAL_COMMUNITY)
Admission: RE | Admit: 2019-12-05 | Discharge: 2019-12-05 | Disposition: A | Discharge status: Home / Self Care | Payer: Medicare Other | Source: Ambulatory Visit | Attending: Orthopedic Surgery | Admitting: Orthopedic Surgery

## 2019-12-07 NOTE — Addendum Note (Signed)
Addended byCaffie Damme on: 12/07/2019 01:28 PM   Modules accepted: Orders, SmartSet

## 2019-12-08 ENCOUNTER — Other Ambulatory Visit: Payer: Self-pay

## 2019-12-08 ENCOUNTER — Encounter (HOSPITAL_COMMUNITY): Payer: Self-pay

## 2019-12-08 ENCOUNTER — Encounter (HOSPITAL_COMMUNITY)
Admission: RE | Admit: 2019-12-08 | Discharge: 2019-12-08 | Disposition: A | Discharge status: Home / Self Care | Payer: Medicare Other | Source: Ambulatory Visit | Attending: Orthopedic Surgery | Admitting: Orthopedic Surgery

## 2019-12-08 ENCOUNTER — Other Ambulatory Visit (HOSPITAL_COMMUNITY)
Admission: RE | Admit: 2019-12-08 | Discharge: 2019-12-08 | Disposition: A | Discharge status: Home / Self Care | Payer: Medicare Other | Source: Ambulatory Visit | Attending: Orthopedic Surgery | Admitting: Orthopedic Surgery

## 2019-12-08 DIAGNOSIS — Z20822 Contact with and (suspected) exposure to covid-19: Secondary | ICD-10-CM | POA: Diagnosis not present

## 2019-12-08 DIAGNOSIS — Z01818 Encounter for other preprocedural examination: Secondary | ICD-10-CM | POA: Insufficient documentation

## 2019-12-08 LAB — CBC WITH DIFFERENTIAL/PLATELET
Abs Immature Granulocytes: 0.01 10*3/uL (ref 0.00–0.07)
Basophils Absolute: 0 10*3/uL (ref 0.0–0.1)
Basophils Relative: 0 %
Eosinophils Absolute: 0 10*3/uL (ref 0.0–0.5)
Eosinophils Relative: 1 %
HCT: 39.5 % (ref 39.0–52.0)
Hemoglobin: 12.6 g/dL — ABNORMAL LOW (ref 13.0–17.0)
Immature Granulocytes: 0 %
Lymphocytes Relative: 33 %
Lymphs Abs: 1.6 10*3/uL (ref 0.7–4.0)
MCH: 27.2 pg (ref 26.0–34.0)
MCHC: 31.9 g/dL (ref 30.0–36.0)
MCV: 85.3 fL (ref 80.0–100.0)
Monocytes Absolute: 0.4 10*3/uL (ref 0.1–1.0)
Monocytes Relative: 8 %
Neutro Abs: 2.8 10*3/uL (ref 1.7–7.7)
Neutrophils Relative %: 58 %
Platelets: 143 10*3/uL — ABNORMAL LOW (ref 150–400)
RBC: 4.63 MIL/uL (ref 4.22–5.81)
RDW: 14.1 % (ref 11.5–15.5)
WBC: 4.8 10*3/uL (ref 4.0–10.5)
nRBC: 0 % (ref 0.0–0.2)

## 2019-12-08 LAB — PREPARE RBC (CROSSMATCH)

## 2019-12-08 LAB — BASIC METABOLIC PANEL
Anion gap: 9 (ref 5–15)
BUN: 16 mg/dL (ref 8–23)
CO2: 22 mmol/L (ref 22–32)
Calcium: 8.9 mg/dL (ref 8.9–10.3)
Chloride: 105 mmol/L (ref 98–111)
Creatinine, Ser: 1.3 mg/dL — ABNORMAL HIGH (ref 0.61–1.24)
GFR calc Af Amer: >60 mL/min (ref >60)
GFR calc non Af Amer: 54 mL/min — ABNORMAL LOW (ref >60)
Glucose, Bld: 125 mg/dL — ABNORMAL HIGH (ref 70–99)
Potassium: 3.5 mmol/L (ref 3.5–5.1)
Sodium: 136 mmol/L (ref 135–145)

## 2019-12-08 LAB — HEMOGLOBIN A1C
Hgb A1c MFr Bld: 7 % — ABNORMAL HIGH (ref 4.8–5.6)
Mean Plasma Glucose: 154.2 mg/dL

## 2019-12-08 LAB — ABO/RH: ABO/RH(D): A POS

## 2019-12-09 LAB — SARS CORONAVIRUS 2 (TAT 6-24 HRS): SARS Coronavirus 2: NEGATIVE

## 2019-12-12 ENCOUNTER — Other Ambulatory Visit: Payer: Self-pay

## 2019-12-12 ENCOUNTER — Encounter (HOSPITAL_COMMUNITY)
Admission: RE | Disposition: A | Discharge status: Home / Self Care | Payer: Self-pay | Source: Home / Self Care | Attending: Orthopedic Surgery

## 2019-12-12 ENCOUNTER — Ambulatory Visit (HOSPITAL_COMMUNITY): Payer: Medicare Other

## 2019-12-12 ENCOUNTER — Encounter (HOSPITAL_COMMUNITY): Payer: Self-pay | Admitting: Orthopedic Surgery

## 2019-12-12 ENCOUNTER — Ambulatory Visit (HOSPITAL_COMMUNITY): Payer: Medicare Other | Admitting: Anesthesiology

## 2019-12-12 ENCOUNTER — Observation Stay (HOSPITAL_COMMUNITY)
Admission: RE | Admit: 2019-12-12 | Discharge: 2019-12-13 | Disposition: A | Discharge status: Home / Self Care | Payer: Medicare Other | Attending: Orthopedic Surgery | Admitting: Orthopedic Surgery

## 2019-12-12 DIAGNOSIS — Z794 Long term (current) use of insulin: Secondary | ICD-10-CM | POA: Diagnosis not present

## 2019-12-12 DIAGNOSIS — D696 Thrombocytopenia, unspecified: Secondary | ICD-10-CM | POA: Diagnosis not present

## 2019-12-12 DIAGNOSIS — Z471 Aftercare following joint replacement surgery: Secondary | ICD-10-CM | POA: Diagnosis not present

## 2019-12-12 DIAGNOSIS — M1712 Unilateral primary osteoarthritis, left knee: Principal | ICD-10-CM

## 2019-12-12 DIAGNOSIS — Z96652 Presence of left artificial knee joint: Secondary | ICD-10-CM | POA: Diagnosis not present

## 2019-12-12 DIAGNOSIS — I10 Essential (primary) hypertension: Secondary | ICD-10-CM | POA: Diagnosis not present

## 2019-12-12 DIAGNOSIS — Z96641 Presence of right artificial hip joint: Secondary | ICD-10-CM | POA: Insufficient documentation

## 2019-12-12 DIAGNOSIS — Z87891 Personal history of nicotine dependence: Secondary | ICD-10-CM | POA: Diagnosis not present

## 2019-12-12 DIAGNOSIS — Z79899 Other long term (current) drug therapy: Secondary | ICD-10-CM | POA: Diagnosis not present

## 2019-12-12 DIAGNOSIS — Z96659 Presence of unspecified artificial knee joint: Secondary | ICD-10-CM

## 2019-12-12 DIAGNOSIS — E119 Type 2 diabetes mellitus without complications: Secondary | ICD-10-CM | POA: Diagnosis not present

## 2019-12-12 HISTORY — PX: TOTAL KNEE ARTHROPLASTY: SHX125

## 2019-12-12 LAB — GLUCOSE, CAPILLARY
Glucose-Capillary: 130 mg/dL — ABNORMAL HIGH (ref 70–99)
Glucose-Capillary: 192 mg/dL — ABNORMAL HIGH (ref 70–99)
Glucose-Capillary: 207 mg/dL — ABNORMAL HIGH (ref 70–99)
Glucose-Capillary: 247 mg/dL — ABNORMAL HIGH (ref 70–99)

## 2019-12-12 SURGERY — ARTHROPLASTY, KNEE, TOTAL
Anesthesia: Spinal | Site: Knee | Laterality: Left

## 2019-12-12 MED ORDER — PHENYLEPHRINE HCL (PRESSORS) 10 MG/ML IV SOLN
INTRAVENOUS | Status: DC | PRN
  Administered 2019-12-12 (×2): 50 ug via INTRAVENOUS

## 2019-12-12 MED ORDER — ONDANSETRON HCL 4 MG/2ML IJ SOLN
4.0000 mg | Freq: Once | INTRAMUSCULAR | Status: AC
  Administered 2019-12-12: 4 mg via INTRAVENOUS
  Filled 2019-12-12: qty 2

## 2019-12-12 MED ORDER — MIDAZOLAM HCL 2 MG/2ML IJ SOLN
INTRAMUSCULAR | Status: AC
  Filled 2019-12-12: qty 2

## 2019-12-12 MED ORDER — ONDANSETRON HCL 4 MG PO TABS
4.0000 mg | ORAL_TABLET | Freq: Four times a day (QID) | ORAL | Status: DC | PRN
  Administered 2019-12-13: 4 mg via ORAL
  Filled 2019-12-12: qty 1

## 2019-12-12 MED ORDER — BUPIVACAINE-EPINEPHRINE (PF) 0.5% -1:200000 IJ SOLN
INTRAMUSCULAR | Status: AC
  Filled 2019-12-12: qty 30

## 2019-12-12 MED ORDER — ROPIVACAINE HCL 5 MG/ML IJ SOLN
INTRAMUSCULAR | Status: AC
  Filled 2019-12-12: qty 30

## 2019-12-12 MED ORDER — METOCLOPRAMIDE HCL 5 MG/ML IJ SOLN: 5.0000 mg | Freq: Three times a day (TID) | INTRAMUSCULAR | Status: DC | PRN

## 2019-12-12 MED ORDER — CHLORHEXIDINE GLUCONATE 0.12 % MT SOLN
15.0000 mL | Freq: Once | OROMUCOSAL | Status: AC
  Administered 2019-12-12: 15 mL via OROMUCOSAL

## 2019-12-12 MED ORDER — HYDROCODONE-ACETAMINOPHEN 7.5-325 MG PO TABS
1.0000 | ORAL_TABLET | ORAL | Status: DC | PRN
  Administered 2019-12-13: 2 via ORAL
  Filled 2019-12-12: qty 2

## 2019-12-12 MED ORDER — HYDROCODONE-ACETAMINOPHEN 7.5-325 MG PO TABS
1.0000 | ORAL_TABLET | Freq: Once | ORAL | Status: AC
  Administered 2019-12-12: 1 via ORAL
  Filled 2019-12-12: qty 1

## 2019-12-12 MED ORDER — CHLORHEXIDINE GLUCONATE 0.12 % MT SOLN
OROMUCOSAL | Status: AC
  Filled 2019-12-12: qty 15

## 2019-12-12 MED ORDER — METHOCARBAMOL 1000 MG/10ML IJ SOLN
500.0000 mg | Freq: Four times a day (QID) | INTRAVENOUS | Status: DC | PRN
  Filled 2019-12-12: qty 5

## 2019-12-12 MED ORDER — BUPIVACAINE LIPOSOME 1.3 % IJ SUSP
INTRAMUSCULAR | Status: DC | PRN
  Administered 2019-12-12: 20 mL

## 2019-12-12 MED ORDER — DEXAMETHASONE SODIUM PHOSPHATE 4 MG/ML IJ SOLN
INTRAMUSCULAR | Status: AC
  Filled 2019-12-12: qty 2

## 2019-12-12 MED ORDER — TRANEXAMIC ACID-NACL 1000-0.7 MG/100ML-% IV SOLN
1000.0000 mg | INTRAVENOUS | Status: AC
  Administered 2019-12-12: 1000 mg via INTRAVENOUS
  Filled 2019-12-12: qty 100

## 2019-12-12 MED ORDER — CEFAZOLIN SODIUM-DEXTROSE 2-4 GM/100ML-% IV SOLN
2.0000 g | INTRAVENOUS | Status: AC
  Administered 2019-12-12: 2 g via INTRAVENOUS
  Filled 2019-12-12: qty 100

## 2019-12-12 MED ORDER — BUPIVACAINE LIPOSOME 1.3 % IJ SUSP
INTRAMUSCULAR | Status: AC
  Filled 2019-12-12: qty 20

## 2019-12-12 MED ORDER — DEXAMETHASONE SODIUM PHOSPHATE 10 MG/ML IJ SOLN
INTRAMUSCULAR | Status: DC | PRN
  Administered 2019-12-12: 5 mg via INTRAVENOUS

## 2019-12-12 MED ORDER — 0.9 % SODIUM CHLORIDE (POUR BTL) OPTIME
TOPICAL | Status: DC | PRN
  Administered 2019-12-12: 1000 mL

## 2019-12-12 MED ORDER — TRANEXAMIC ACID-NACL 1000-0.7 MG/100ML-% IV SOLN
1000.0000 mg | Freq: Once | INTRAVENOUS | Status: AC
  Administered 2019-12-12: 1000 mg via INTRAVENOUS
  Filled 2019-12-12: qty 100

## 2019-12-12 MED ORDER — BISACODYL 5 MG PO TBEC: 5.0000 mg | DELAYED_RELEASE_TABLET | Freq: Every day | ORAL | Status: DC | PRN

## 2019-12-12 MED ORDER — HYDROCODONE-ACETAMINOPHEN 5-325 MG PO TABS: 1.0000 | ORAL_TABLET | ORAL | Status: DC | PRN

## 2019-12-12 MED ORDER — DEXAMETHASONE SODIUM PHOSPHATE 10 MG/ML IJ SOLN
INTRAMUSCULAR | Status: AC
  Filled 2019-12-12: qty 1

## 2019-12-12 MED ORDER — FENTANYL CITRATE (PF) 100 MCG/2ML IJ SOLN
INTRAMUSCULAR | Status: DC | PRN
  Administered 2019-12-12 (×2): 50 ug via INTRAVENOUS

## 2019-12-12 MED ORDER — MORPHINE SULFATE (PF) 2 MG/ML IV SOLN
0.5000 mg | INTRAVENOUS | Status: DC | PRN
  Administered 2019-12-13: 0.5 mg via INTRAVENOUS
  Filled 2019-12-12: qty 1

## 2019-12-12 MED ORDER — MIDAZOLAM HCL 5 MG/5ML IJ SOLN
INTRAMUSCULAR | Status: DC | PRN
  Administered 2019-12-12: 2 mg via INTRAVENOUS

## 2019-12-12 MED ORDER — INSULIN ASPART 100 UNIT/ML ~~LOC~~ SOLN
0.0000 [IU] | Freq: Three times a day (TID) | SUBCUTANEOUS | Status: DC
  Administered 2019-12-13: 1 [IU] via SUBCUTANEOUS
  Administered 2019-12-13: 2 [IU] via SUBCUTANEOUS

## 2019-12-12 MED ORDER — SIMVASTATIN 20 MG PO TABS
20.0000 mg | ORAL_TABLET | Freq: Every day | ORAL | Status: DC
  Administered 2019-12-12: 20 mg via ORAL
  Filled 2019-12-12: qty 1

## 2019-12-12 MED ORDER — HYDROMORPHONE HCL 1 MG/ML IJ SOLN
INTRAMUSCULAR | Status: AC
  Filled 2019-12-12: qty 0.5

## 2019-12-12 MED ORDER — IBUPROFEN 400 MG PO TABS
400.0000 mg | ORAL_TABLET | Freq: Once | ORAL | Status: AC
  Administered 2019-12-12: 400 mg via ORAL
  Filled 2019-12-12: qty 1

## 2019-12-12 MED ORDER — HYDROMORPHONE HCL 1 MG/ML IJ SOLN
0.5000 mg | INTRAMUSCULAR | Status: AC | PRN
  Administered 2019-12-12 (×4): 0.5 mg via INTRAVENOUS
  Filled 2019-12-12: qty 0.5

## 2019-12-12 MED ORDER — METHOCARBAMOL 500 MG PO TABS
500.0000 mg | ORAL_TABLET | Freq: Four times a day (QID) | ORAL | Status: DC | PRN
  Administered 2019-12-13: 500 mg via ORAL
  Filled 2019-12-12: qty 1

## 2019-12-12 MED ORDER — CELECOXIB 100 MG PO CAPS
200.0000 mg | ORAL_CAPSULE | Freq: Two times a day (BID) | ORAL | Status: DC
  Administered 2019-12-12 – 2019-12-13 (×2): 200 mg via ORAL
  Filled 2019-12-12 (×3): qty 2

## 2019-12-12 MED ORDER — EPHEDRINE 5 MG/ML INJ
INTRAVENOUS | Status: AC
  Filled 2019-12-12: qty 10

## 2019-12-12 MED ORDER — EPHEDRINE SULFATE 50 MG/ML IJ SOLN
INTRAMUSCULAR | Status: DC | PRN
  Administered 2019-12-12 (×5): 10 mg via INTRAVENOUS

## 2019-12-12 MED ORDER — PROPOFOL 500 MG/50ML IV EMUL
INTRAVENOUS | Status: DC | PRN
  Administered 2019-12-12: 75 ug/kg/min via INTRAVENOUS

## 2019-12-12 MED ORDER — FENTANYL CITRATE (PF) 100 MCG/2ML IJ SOLN
INTRAMUSCULAR | Status: AC
  Filled 2019-12-12: qty 2

## 2019-12-12 MED ORDER — DIPHENHYDRAMINE HCL 12.5 MG/5ML PO ELIX: 12.5000 mg | ORAL_SOLUTION | ORAL | Status: DC | PRN

## 2019-12-12 MED ORDER — POLYETHYLENE GLYCOL 3350 17 G PO PACK
17.0000 g | PACK | Freq: Every day | ORAL | Status: DC
  Administered 2019-12-12 – 2019-12-13 (×2): 17 g via ORAL
  Filled 2019-12-12 (×2): qty 1

## 2019-12-12 MED ORDER — ORAL CARE MOUTH RINSE: 15.0000 mL | Freq: Once | OROMUCOSAL | Status: AC

## 2019-12-12 MED ORDER — ONDANSETRON HCL 4 MG/2ML IJ SOLN
INTRAMUSCULAR | Status: AC
  Filled 2019-12-12: qty 2

## 2019-12-12 MED ORDER — HYDROMORPHONE HCL 1 MG/ML IJ SOLN
0.2500 mg | INTRAMUSCULAR | Status: DC | PRN
  Administered 2019-12-12 (×4): 0.5 mg via INTRAVENOUS
  Filled 2019-12-12 (×6): qty 0.5

## 2019-12-12 MED ORDER — LIDOCAINE HCL (PF) 1 % IJ SOLN
INTRAMUSCULAR | Status: AC
  Filled 2019-12-12: qty 30

## 2019-12-12 MED ORDER — POVIDONE-IODINE 10 % EX SWAB: 2.0000 "application " | Freq: Once | CUTANEOUS | Status: DC

## 2019-12-12 MED ORDER — PROPOFOL 10 MG/ML IV BOLUS
INTRAVENOUS | Status: AC
  Filled 2019-12-12: qty 40

## 2019-12-12 MED ORDER — ONDANSETRON HCL 4 MG/2ML IJ SOLN: 4.0000 mg | Freq: Once | INTRAMUSCULAR | Status: DC | PRN

## 2019-12-12 MED ORDER — INSULIN GLARGINE 100 UNIT/ML ~~LOC~~ SOLN
28.0000 [IU] | Freq: Every day | SUBCUTANEOUS | Status: DC
  Administered 2019-12-13: 28 [IU] via SUBCUTANEOUS
  Filled 2019-12-12 (×3): qty 0.28

## 2019-12-12 MED ORDER — METOCLOPRAMIDE HCL 10 MG PO TABS: 5.0000 mg | ORAL_TABLET | Freq: Three times a day (TID) | ORAL | Status: DC | PRN

## 2019-12-12 MED ORDER — SODIUM CHLORIDE 0.9 % IV SOLN: INTRAVENOUS | Status: DC

## 2019-12-12 MED ORDER — TRAMADOL HCL 50 MG PO TABS
50.0000 mg | ORAL_TABLET | Freq: Four times a day (QID) | ORAL | Status: DC
  Administered 2019-12-12 – 2019-12-13 (×3): 50 mg via ORAL
  Filled 2019-12-12 (×3): qty 1

## 2019-12-12 MED ORDER — SODIUM CHLORIDE 0.9 % IR SOLN
Status: DC | PRN
  Administered 2019-12-12: 3000 mL

## 2019-12-12 MED ORDER — PHENYLEPHRINE 40 MCG/ML (10ML) SYRINGE FOR IV PUSH (FOR BLOOD PRESSURE SUPPORT)
PREFILLED_SYRINGE | INTRAVENOUS | Status: AC
  Filled 2019-12-12: qty 10

## 2019-12-12 MED ORDER — PHENOL 1.4 % MT LIQD: 1.0000 | OROMUCOSAL | Status: DC | PRN

## 2019-12-12 MED ORDER — MENTHOL 3 MG MT LOZG: 1.0000 | LOZENGE | OROMUCOSAL | Status: DC | PRN

## 2019-12-12 MED ORDER — ONDANSETRON HCL 4 MG/2ML IJ SOLN
4.0000 mg | Freq: Four times a day (QID) | INTRAMUSCULAR | Status: DC | PRN
  Administered 2019-12-12: 4 mg via INTRAVENOUS
  Filled 2019-12-12: qty 2

## 2019-12-12 MED ORDER — LISINOPRIL 10 MG PO TABS
20.0000 mg | ORAL_TABLET | Freq: Every day | ORAL | Status: DC
  Administered 2019-12-13: 20 mg via ORAL
  Filled 2019-12-12: qty 2

## 2019-12-12 MED ORDER — PROPOFOL 10 MG/ML IV BOLUS
INTRAVENOUS | Status: AC
  Filled 2019-12-12: qty 60

## 2019-12-12 MED ORDER — ASPIRIN 81 MG PO CHEW
81.0000 mg | CHEWABLE_TABLET | Freq: Two times a day (BID) | ORAL | Status: DC
  Administered 2019-12-12 – 2019-12-13 (×2): 81 mg via ORAL
  Filled 2019-12-12 (×2): qty 1

## 2019-12-12 MED ORDER — DOCUSATE SODIUM 100 MG PO CAPS
100.0000 mg | ORAL_CAPSULE | Freq: Two times a day (BID) | ORAL | Status: DC
  Administered 2019-12-12 – 2019-12-13 (×2): 100 mg via ORAL
  Filled 2019-12-12 (×2): qty 1

## 2019-12-12 MED ORDER — CEFAZOLIN SODIUM-DEXTROSE 2-4 GM/100ML-% IV SOLN
2.0000 g | Freq: Four times a day (QID) | INTRAVENOUS | Status: AC
  Administered 2019-12-12 – 2019-12-13 (×2): 2 g via INTRAVENOUS
  Filled 2019-12-12 (×2): qty 100

## 2019-12-12 MED ORDER — GABAPENTIN 100 MG PO CAPS
100.0000 mg | ORAL_CAPSULE | Freq: Once | ORAL | Status: AC
  Administered 2019-12-12: 100 mg via ORAL
  Filled 2019-12-12: qty 1

## 2019-12-12 MED ORDER — ONDANSETRON HCL 4 MG/2ML IJ SOLN
INTRAMUSCULAR | Status: DC | PRN
  Administered 2019-12-12: 4 mg via INTRAVENOUS

## 2019-12-12 MED ORDER — ALUM & MAG HYDROXIDE-SIMETH 200-200-20 MG/5ML PO SUSP
30.0000 mL | ORAL | Status: DC | PRN
  Administered 2019-12-12: 30 mL via ORAL
  Filled 2019-12-12: qty 30

## 2019-12-12 MED ORDER — LACTATED RINGERS IV SOLN
INTRAVENOUS | Status: DC
  Administered 2019-12-12: 1000 mL via INTRAVENOUS

## 2019-12-12 SURGICAL SUPPLY — 69 items
ATTUNE MED DOME PAT 41 KNEE (Knees) ×2 IMPLANT
ATTUNE PS FEM LT SZ 6 CEM KNEE (Femur) ×2 IMPLANT
BANDAGE ELASTIC 4 VELCRO NS (GAUZE/BANDAGES/DRESSINGS) ×4 IMPLANT
BANDAGE ELASTIC 6 VELCRO NS (GAUZE/BANDAGES/DRESSINGS) ×2 IMPLANT
BANDAGE ESMARK 6X9 LF (GAUZE/BANDAGES/DRESSINGS) ×1 IMPLANT
BASE TIBIAL CEM ATTUNE SZ 7 (Knees) ×2 IMPLANT
BASEPLATE TIB CEM ATTUNE SZ7 (Knees) ×1 IMPLANT
BLADE HEX COATED 2.75 (ELECTRODE) ×2 IMPLANT
BLADE SAGITTAL 25.0X1.27X90 (BLADE) ×2 IMPLANT
BLADE SAW SGTL 11.0X1.19X90.0M (BLADE) ×2 IMPLANT
BNDG ELASTIC 4X5.8 VLCR NS LF (GAUZE/BANDAGES/DRESSINGS) ×4 IMPLANT
BNDG ELASTIC 6X5.8 VLCR NS LF (GAUZE/BANDAGES/DRESSINGS) ×2 IMPLANT
BNDG ESMARK 6X9 LF (GAUZE/BANDAGES/DRESSINGS) ×2
CEMENT HV SMART SET (Cement) ×4 IMPLANT
CLOTH BEACON ORANGE TIMEOUT ST (SAFETY) ×2 IMPLANT
COOLER CRYO CUFF IC AND MOTOR (MISCELLANEOUS) ×2 IMPLANT
COVER LIGHT HANDLE STERIS (MISCELLANEOUS) ×4 IMPLANT
COVER WAND RF STERILE (DRAPES) ×2 IMPLANT
CUFF CRYO KNEE18X23 MED (MISCELLANEOUS) ×2 IMPLANT
CUFF TOURN SGL QUICK 34 (TOURNIQUET CUFF) ×1
CUFF TRNQT CYL 34X4.125X (TOURNIQUET CUFF) ×1 IMPLANT
DECANTER SPIKE VIAL GLASS SM (MISCELLANEOUS) ×2 IMPLANT
DRAPE BACK TABLE (DRAPES) ×2 IMPLANT
DRAPE EXTREMITY T 121X128X90 (DISPOSABLE) ×2 IMPLANT
DRSG MEPILEX BORDER 4X12 (GAUZE/BANDAGES/DRESSINGS) ×2 IMPLANT
DURAPREP 26ML APPLICATOR (WOUND CARE) ×4 IMPLANT
ELECT REM PT RETURN 9FT ADLT (ELECTROSURGICAL) ×2
ELECTRODE REM PT RTRN 9FT ADLT (ELECTROSURGICAL) ×1 IMPLANT
GLOVE BIOGEL PI IND STRL 7.0 (GLOVE) ×4 IMPLANT
GLOVE BIOGEL PI INDICATOR 7.0 (GLOVE) ×4
GLOVE ECLIPSE 7.0 STRL STRAW (GLOVE) ×6 IMPLANT
GLOVE SKINSENSE NS SZ8.0 LF (GLOVE) ×2
GLOVE SKINSENSE STRL SZ8.0 LF (GLOVE) ×2 IMPLANT
GLOVE SS N UNI LF 8.5 STRL (GLOVE) ×2 IMPLANT
GOWN STRL REUS W/TWL LRG LVL3 (GOWN DISPOSABLE) ×6 IMPLANT
GOWN STRL REUS W/TWL XL LVL3 (GOWN DISPOSABLE) ×2 IMPLANT
HANDPIECE INTERPULSE COAX TIP (DISPOSABLE) ×1
HOOD W/PEELAWAY (MISCELLANEOUS) ×8 IMPLANT
INSERT TIB ATTUNE FB SZ6X7 (Insert) ×2 IMPLANT
INST SET MAJOR BONE (KITS) ×2 IMPLANT
IV NS IRRIG 3000ML ARTHROMATIC (IV SOLUTION) ×2 IMPLANT
KIT BLADEGUARD II DBL (SET/KITS/TRAYS/PACK) ×2 IMPLANT
KIT TURNOVER KIT A (KITS) ×2 IMPLANT
MANIFOLD NEPTUNE II (INSTRUMENTS) ×2 IMPLANT
MARKER SKIN DUAL TIP RULER LAB (MISCELLANEOUS) ×2 IMPLANT
NEEDLE HYPO 18GX1.5 BLUNT FILL (NEEDLE) ×2 IMPLANT
NEEDLE HYPO 21X1.5 SAFETY (NEEDLE) ×2 IMPLANT
NS IRRIG 1000ML POUR BTL (IV SOLUTION) ×2 IMPLANT
PACK TOTAL JOINT (CUSTOM PROCEDURE TRAY) ×2 IMPLANT
PAD ARMBOARD 7.5X6 YLW CONV (MISCELLANEOUS) ×2 IMPLANT
PENCIL SMOKE EVACUATOR (MISCELLANEOUS) ×2 IMPLANT
PILLOW KNEE EXTENSION 0 DEG (MISCELLANEOUS) ×2 IMPLANT
PIN THREADED HEADED SIGMA (PIN) ×2 IMPLANT
PIN/DRILL PACK ORTHO 1/8X3.0 (PIN) ×2 IMPLANT
SAW OSC TIP CART 19.5X105X1.3 (SAW) ×2 IMPLANT
SET BASIN LINEN APH (SET/KITS/TRAYS/PACK) ×2 IMPLANT
SET HNDPC FAN SPRY TIP SCT (DISPOSABLE) ×1 IMPLANT
STAPLER VISISTAT 35W (STAPLE) ×2 IMPLANT
SUT BRALON NAB BRD #1 30IN (SUTURE) ×2 IMPLANT
SUT MNCRL 0 VIOLET CTX 36 (SUTURE) ×1 IMPLANT
SUT MON AB 0 CT1 (SUTURE) ×2 IMPLANT
SUT MONOCRYL 0 CTX 36 (SUTURE) ×1
SYR 20ML LL LF (SYRINGE) ×2 IMPLANT
SYR BULB IRRIG 60ML STRL (SYRINGE) ×2 IMPLANT
TOWEL OR 17X26 4PK STRL BLUE (TOWEL DISPOSABLE) ×2 IMPLANT
TOWER CARTRIDGE SMART MIX (DISPOSABLE) ×2 IMPLANT
TRAY FOLEY MTR SLVR 16FR STAT (SET/KITS/TRAYS/PACK) ×2 IMPLANT
WATER STERILE IRR 1000ML POUR (IV SOLUTION) ×4 IMPLANT
YANKAUER SUCT 12FT TUBE ARGYLE (SUCTIONS) ×2 IMPLANT

## 2019-12-12 NOTE — Op Note (Signed)
12/12/2019  10:11 AM  PATIENT:  [facility]  74 y.o. male  PRE-OPERATIVE DIAGNOSIS:  left knee osteoarthritis  POST-OPERATIVE DIAGNOSIS:  left knee osteoarthritis  PROCEDURE:  Procedure(s): TOTAL KNEE ARTHROPLASTY/ Left (Left)   Attune fixed-bearing posterior stabilized total knee Size 6 femur Size 7 tibia Size 7 poly- Size 41 x 11 patella  Femoral cut 11 Medial tibial cut 3 Patella size 25 cut to 14  20 cc exparel full-strength at the end of the case  SURGEON:  Surgeon(s) and Role:    Carole Civil, MD - Primary  PHYSICIAN ASSISTANT:   ASSISTANTS: angie collins and debbie dallas    ANESTHESIA:   spinal  EBL:  min   BLOOD ADMINISTERED:none  DRAINS: none     SPECIMEN:  No Specimen  DISPOSITION OF SPECIMEN:  N/A  COUNTS:  YES  TOURNIQUET:   Total Tourniquet Time Documented: Thigh (Left) - 93 minutes Total: Thigh (Left) - 93 minutes   DICTATION: .Viviann Spare Dictation  PLAN OF CARE: Admit to inpatient   PATIENT DISPOSITION:  PACU - hemodynamically stable.   Delay start of Pharmacological VTE agent (>24hrs) due to surgical blood loss or risk of bleeding: yes  [id number]  [facility] was seen in the preop area.  His left knee was confirmed as the surgical site chart update completed and he was brought to surgery.  He had a spinal anesthetic.  A Foley catheter was inserted.  His leg was prepped and draped sterilely.  Timeout was completed.  The tourniquet was elevated to 300 mmHg after exsanguination with a 6 inch Esmarch  Midline incision was made.  Subcutaneous tissue divided to the extensor mechanism.  The medial arthrotomy was performed the patella was everted the fat pad was excised.  The proximal patella was scarred to the tibia.  The patient had a large osteophytes anteriorly and medially on the tibia as well as peripherally around the patella he had full thickness cartilage loss on the medial side the medial and lateral menisci were intact as was the  ACL.  PCL was intact.  There were large osteophytes around the femur proximally and laterally and medially.  Medial soft tissue sleeve was then elevated.  These osteophytes were excised  A mid line drill hole was made in the femur it was suctioned until the fluid was clear fat and bone were removed.  The femoral guide was set for 11 mm distal resection 5 degree valgus alignment and left knee.  After pinning the block the 11 mm of distal femur was removed and the shape of the femur was in a figure-of-eight shape.  The femur then was measured to a size 6 pinned using Whitesides line and the epicondylar axis for rotation.  The 4 cuts were made.  Posterior osteophytes were excised from the femur.  The tibia was subluxated forward the medial and lateral meniscal posterior horns were excised.  The reference point for the tibial cut was made from the lower medial side with a 3 mm stylus.  The aim was for neutral varus valgus cut with 3 degrees posterior slope.  Block was pinned in place proximal tibia was removed.  Tibia was then measured to a size 7.  Extension gap balance was performed with a 5, 6 and 7 mm insert obtaining stability at 7 and then this was checked in flexion and it was stable.  We used the external rotation internal rotation test at 90 degrees of flexion and there was no liftoff on  either side  The notch was then cut after pinning the block  The patella was everted and cut from 25 mm down to 14 mm and measured for 41 button  Trial reduction was performed passive flexion test was 95 degrees full extension was obtained.  Trial components were removed the bone was irrigated and dried cement was prepared and then placed on the bone components were inserted excess cement was removed cement was allowed to cure.  The wound was then irrigated any excess cement was removed  Passive flexion test was again repeated 95 degrees of flexion was noted.  120 degrees of flexion was obtainable.  Knee  extension full with no instability.  Patella tracking normal.  Soft tissues were injected with exparel 20 cc full-strength  Final irrigation was performed knee was closed with #1 Braylon running fashion 2 layers of 0 Monocryl and staples  Sterile dressing, long TED [facility] and Cryo/Cuff were applied patient was taken to recovery room in stable condition.

## 2019-12-12 NOTE — Anesthesia Postprocedure Evaluation (Signed)
Anesthesia Post Note  Patient: [name]  Procedure(s) Performed: TOTAL KNEE ARTHROPLASTY/ Left (Left Knee)  Patient location during evaluation: PACU Anesthesia Type: Spinal Level of consciousness: awake, oriented, awake and alert and patient cooperative Pain management: pain level controlled Vital Signs Assessment: post-procedure vital signs reviewed and stable Respiratory status: spontaneous breathing, nonlabored ventilation and respiratory function stable Cardiovascular status: blood pressure returned to baseline and stable Postop Assessment: no headache and no backache Anesthetic complications: no   No complications documented.   Last Vitals:  Vitals:   12/12/19 0646  Pulse: (P) 64  Temp: (P) 36.6 C  SpO2: (P) 97%    Last Pain:  Vitals:   12/12/19 0646  TempSrc: (P) Oral                 Brynda Peon

## 2019-12-12 NOTE — Brief Op Note (Signed)
12/12/2019  10:11 AM  PATIENT:  [facility]  74 y.o. male  PRE-OPERATIVE DIAGNOSIS:  left knee osteoarthritis  POST-OPERATIVE DIAGNOSIS:  left knee osteoarthritis  PROCEDURE:  Procedure(s): TOTAL KNEE ARTHROPLASTY/ Left (Left)   Attune fixed-bearing posterior stabilized total knee Size 6 femur Size 7 tibia Size 7 poly- Size 41 x 11 patella  Femoral cut 11 Medial tibial cut 3 Patella size 25 cut to 14  20 cc exparel full-strength at the end of the case  SURGEON:  Surgeon(s) and Role:    Vickki Hearing, MD - Primary  PHYSICIAN ASSISTANT:   ASSISTANTS: angie collins and debbie dallas    ANESTHESIA:   spinal  EBL:  min   BLOOD ADMINISTERED:none  DRAINS: none     SPECIMEN:  No Specimen  DISPOSITION OF SPECIMEN:  N/A  COUNTS:  YES  TOURNIQUET:   Total Tourniquet Time Documented: Thigh (Left) - 93 minutes Total: Thigh (Left) - 93 minutes   DICTATION: .Reubin Milan Dictation  PLAN OF CARE: Admit to inpatient   PATIENT DISPOSITION:  PACU - hemodynamically stable.   Delay start of Pharmacological VTE agent (>24hrs) due to surgical blood loss or risk of bleeding: yes

## 2019-12-12 NOTE — H&P (Signed)
PREOP CONSULT      Chief Complaint  Patient presents with  . Knee Pain    left     74 year old male status post right total hip referred to me by Dr. Hilda Lias who presented to him with chronic pain in his left knee.  He tried some routine nonoperative measures but he did not improve  The patient is here with his daughter who is a Financial trader.  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  They have moved here from [place]   He complains of 8 out of 9 pain that is worse which is located in the back of his knee associated with decreased range of motion trouble getting in and out of a car off the couch.  His pain is worse with cold weather he notices swelling of his knee and he is currently using a cane.  He did take naproxen hydrocodone  He is diabetic has hypertension  Primary care doctor is Dr. Felecia Shelling   Review of Systems  Respiratory: Negative.  Negative for shortness of breath.   Cardiovascular: Negative.  Negative for chest pain.  All other systems reviewed and are negative.        Past Medical History:  Diagnosis Date  . Arthritis   . Diabetes mellitus without complication (HCC)   . High blood pressure   . Thrombocytopenia (HCC) 12/31/2015    Past Surgical History:  Procedure Laterality Date  . HIP SURGERY Right 2013   arthroplasty         Family History  Problem Relation Age of Onset  . Hypertension Mother    Social History       Tobacco Use  . Smoking status: Former Games developer  . Smokeless tobacco: Never Used  Substance Use Topics  . Alcohol use: Not on file  . Drug use: Not on file    No Known Allergies   Active Medications      Current Meds  Medication Sig  . HYDROcodone-acetaminophen (NORCO/VICODIN) 5-325 MG tablet One tablet every six hours for pain.  Limit 7 days.  . Insulin Glargine (TOUJEO SOLOSTAR Nambe) Inject into the skin.  Marland Kitchen lisinopril (PRINIVIL,ZESTRIL) 20 MG tablet Take 20 mg by mouth daily.  . naproxen  (NAPROSYN) 500 MG tablet Take 1 tablet (500 mg total) by mouth 2 (two) times daily with a meal.  . simvastatin (ZOCOR) 20 MG tablet Take 20 mg by mouth daily.  . tamsulosin (FLOMAX) 0.4 MG CAPS capsule Take 0.4 mg by mouth daily.      BP 133/88   Pulse 73   Ht 5\' 7"  (1.702 m)   Wt 209 lb (94.8 kg)   BMI 32.73 kg/m   Physical Exam General appearance: Normal development no nutritional abnormalities normal body habitus no gross deformities well groomed  Peripheral pulses are intact with no varicosities or swelling normal temperature without edema  Lymph system normal Ortho Exam He ambulates with a cane he has a varus knee no significant thrust on the left side.  He really has mild tenderness over the femoral condyle medially none laterally tibia is nontender posterior medial joint line is nontender.  He has a slight flexion contracture and he barely reaches 90 degrees of flexion but has a stable knee with normal strength and muscle tone good quadriceps function is noted.  The skin is clean  Sensations intact he is oriented x3 mood is pleasant   MEDICAL DECISION SECTION  xrays ordered?  Axial patella view shows patellofemoral  arthritis with normal tracking  My independent reading of xrays: X-rays which were previously done in our office show a varus knee with severe loss of joint space medially subchondral sclerosis osteophytes posteriorly medially and anteriorly  MRI was also obtained which showed IMPRESSION: 1. Severely degenerated and torn medial meniscus. 2. 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 3. Significant tricompartmental degenerative changes most notable in the medial compartment. 4. Moderate-sized joint effusion, moderate synovitis and findings of lipoma arborescens. There is a 2 cm loose ossified body in the posterior joint space. 5. MCL and pes anserine bursitis.   Electronically Signed By: Marijo Sanes M.D. On: 09/28/2019  05:40  Encounter Diagnosis  Name Primary?  . Chronic pain of left knee Yes     PLAN:   Primary care doctor: Dr. Legrand Rams  Preop assessments:  Social setting, 2 stairs to get in the house no stairs in house  Daughter will be with him after surgery Risk of surgery: Bleeding infection risk of amputation DVT pulmonary embolus stiffness continued postop pain  Alternative treatments: Continue with nonoperative care such as but not limited to Tylenol anti-inflammatories tramadol topical medications bracing chronic pain management  Patient factors that increase risk: Diabetes, history of thrombocytopenia, A1c pending with preop assessment by Dr. Legrand Rams also pending  Pain management: Medication will be tapered off at 6 weeks an alternative measures will be needed to control pain  Postoperatively: Physical therapy will be set up, you have a CPM machine, you have an ice cuff, you will have DVT prevention, you will wear stockings for 4 weeks.  Bedside commode, shower chair, 3 in 1 chair.   Surgical procedure planned: Left total knee arthroplasty  The procedure has been fully reviewed with the patient; The risks and benefits of surgery have been discussed and explained and understood. Alternative treatment has also been reviewed, questions were encouraged and answered. The postoperative plan is also been reviewed.  Nonsurgical treatment as described in the history and physical section was attempted and unsuccessful and the patient has agreed to proceed with surgical intervention to improve their situation.  No orders of the defined types were placed in this encounter.   Arther Abbott, MD 12/12/2019 7:29 AM

## 2019-12-12 NOTE — Interval H&P Note (Signed)
History and Physical Interval Note:  12/12/2019 7:29 AM  Donald Mclaughlin  has presented today for surgery, with the diagnosis of left knee osteoarthritis.  The various methods of treatment have been discussed with the patient and family. After consideration of risks, benefits and other options for treatment, the patient has consented to  Procedure(s): TOTAL KNEE ARTHROPLASTY/ Left (Left) as a surgical intervention.  The patient's history has been reviewed, patient examined, no change in status, stable for surgery.  I have reviewed the patient's chart and labs.  Questions were answered to the patient's satisfaction.     Fuller Canada

## 2019-12-12 NOTE — Anesthesia Procedure Notes (Signed)
Spinal  Start time: 12/12/2019 7:37 AM End time: 12/12/2019 8:08 AM Staffing Performed: anesthesiologist and resident/CRNA  Preanesthetic Checklist Completed: patient identified, IV checked, site marked, risks and benefits discussed, surgical consent, monitors and equipment checked, pre-op evaluation and timeout performed Spinal Block Patient position: sitting Prep: Betadine Patient monitoring: heart rate, cardiac monitor, continuous pulse ox and blood pressure Approach: midline Location: L3-4 Injection technique: single-shot Needle Needle type: Quincke  Needle gauge: 22 G Needle length: 10 cm Assessment Sensory level: T4 Additional Notes Multiple attempts, pt was extremely deep, 24g pencan insufficient length, 22g quincke 5" utilized.

## 2019-12-12 NOTE — Plan of Care (Signed)
  Problem: Acute Rehab PT Goals(only PT should resolve) Goal: Pt Will Go Supine/Side To Sit Outcome: Progressing Flowsheets (Taken 12/12/2019 1522) Pt will go Supine/Side to Sit:  Independently  with modified independence Goal: Patient Will Transfer Sit To/From Stand Outcome: Progressing Flowsheets (Taken 12/12/2019 1522) Patient will transfer sit to/from stand: with modified independence Goal: Pt Will Transfer Bed To Chair/Chair To Bed Outcome: Progressing Flowsheets (Taken 12/12/2019 1522) Pt will Transfer Bed to Chair/Chair to Bed: with modified independence Goal: Pt Will Ambulate Outcome: Progressing Flowsheets (Taken 12/12/2019 1522) Pt will Ambulate:  > 125 feet  with modified independence  with rolling walker   3:22 PM, 12/12/19 Ocie Bob, MPT Physical Therapist with [facility] [phone] office [phone] mobile phone

## 2019-12-12 NOTE — Clinical Social Work Note (Signed)
Patient active with [facility]. Tim with [facility] aware of patient's hospitalization and prepared to provide PT upon discharge.  RW ordered with [facility] through [employer].     Mohit Zirbes, Juleen China, LCSW

## 2019-12-12 NOTE — Transfer of Care (Signed)
Immediate Anesthesia Transfer of Care Note  Patient: [name]  Procedure(s) Performed: TOTAL KNEE ARTHROPLASTY/ Left (Left Knee)  Patient Location: PACU  Anesthesia Type:Spinal  Level of Consciousness: awake, alert  and patient cooperative  Airway & Oxygen Therapy: Patient Spontanous Breathing and Patient connected to nasal cannula oxygen  Post-op Assessment: Report given to RN, Post -op Vital signs reviewed and stable and Patient moving all extremities  Post vital signs: Reviewed and stable  Last Vitals:  Vitals Value Taken Time  BP 136/64 12/12/19 1017  Temp    Pulse 81 12/12/19 1021  Resp 13 12/12/19 1021  SpO2 99 % 12/12/19 1021  Vitals shown include unvalidated device data.  Last Pain:  Vitals:   12/12/19 0646  TempSrc: (P) Oral      Patients Stated Pain Goal: (P) 7 (12/12/19 8099)  Complications: No complications documented.

## 2019-12-12 NOTE — Evaluation (Signed)
Physical Therapy Evaluation Patient Details Name: Donald Mclaughlin MRN: [id number] DOB: 1945/10/25 Today's Date: 12/12/2019  LEFT KNEE ROM:  0 - 95 degrees AMBULATION DISTANCE: 50 feet using RW with Min Guard Assist    History of Present Illness  Donald Mclaughlin is a 74 year old male status post Left TKA on 12/12/19 with diagnosis of OA left knee  Clinical Impression  Patient demonstrates good return for moving LLE during bed mobility, able to achieve up to 95 degrees left knee flexion while self stretching seated at bedside, fair/good return for left heel to toe stepping without loss of balance, limited mostly due to c/o fatigue and put back to bed after therapy.  Patient will benefit from continued physical therapy in hospital and recommended venue below to increase strength, balance, endurance for safe ADLs and gait.    Follow Up Recommendations Home health PT;Supervision - Intermittent;Supervision for mobility/OOB    Equipment Recommendations  Rolling walker with 5" wheels    Recommendations for Other Services       Precautions / Restrictions Precautions Precautions: Fall Restrictions Weight Bearing Restrictions: Yes LLE Weight Bearing: Weight bearing as tolerated Other Position/Activity Restrictions: don't put pillows under left knee      Mobility  Bed Mobility Overal bed mobility: Needs Assistance Bed Mobility: Supine to Sit;Sit to Supine     Supine to sit: Modified independent (Device/Increase time);Supervision     General bed mobility comments: increased time, labored movement  Transfers Overall transfer level: Needs assistance Equipment used: Rolling walker (2 wheeled) Transfers: Sit to/from [employer] Sit to Stand: Min guard;Min assist Stand pivot transfers: Min guard       General transfer comment: required verbal/tactile cueing for completing sit to stands with fair/good carryover  Ambulation/Gait Ambulation/Gait assistance: Min guard Gait  Distance (Feet): 50 Feet Assistive device: Rolling walker (2 wheeled) Gait Pattern/deviations: Decreased step length - left;Decreased stance time - left;Decreased stride length;Antalgic Gait velocity: decreased   General Gait Details: slow labored cadence with fair/good return for left heel to toe stepping without loss of balance, limited secondary to fatigue  Stairs            Wheelchair Mobility    Modified Rankin (Stroke Patients Only)       Balance Overall balance assessment: Needs assistance Sitting-balance support: Feet supported;No upper extremity supported Sitting balance-Leahy Scale: Good Sitting balance - Comments: seated at EOB   Standing balance support: During functional activity;Bilateral upper extremity supported Standing balance-Leahy Scale: Fair Standing balance comment: using RW                             Pertinent Vitals/Pain Pain Assessment: 0-10 Pain Score: 8  Pain Location: left knee Pain Descriptors / Indicators: Sore;Grimacing Pain Intervention(s): Limited activity within patient's tolerance;Monitored during session;Premedicated before session;Ice applied    Home Living Family/patient expects to be discharged to:: Private residence Living Arrangements: Alone Available Help at Discharge: Family;Available PRN/intermittently Type of Home: House Home Access: Ramped entrance     Home Layout: One level Home Equipment: Cane - single point;Shower seat - built in      Prior Function Level of Independence: Independent with assistive device(s)         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[employer], drives     Hand Dominance        Extremity/Trunk Assessment   Upper Extremity Assessment Upper Extremity Assessment: Overall WFL for tasks assessed    Lower Extremity Assessment Lower Extremity  Assessment: Generalized weakness;LLE deficits/detail LLE Deficits / Details: grossly -4/5 LLE: Unable to fully assess due to pain LLE  Sensation: WNL LLE Coordination: WNL    Cervical / Trunk Assessment Cervical / Trunk Assessment: Normal  Communication   Communication: No difficulties  Cognition Arousal/Alertness: Awake/alert Behavior During Therapy: WFL for tasks assessed/performed Overall Cognitive Status: Within Functional Limits for tasks assessed                                        General Comments      Exercises     Assessment/Plan    PT Assessment Patient needs continued PT services  PT Problem List Decreased range of motion;Decreased strength;Decreased activity tolerance;Decreased balance;Decreased mobility;Pain       PT Treatment Interventions Gait training;Stair training;Functional mobility training;Therapeutic activities;Therapeutic exercise;Patient/family education;Balance training    PT Goals (Current goals can be found in the Care Plan section)  Acute Rehab PT Goals Patient Stated Goal: return home with family to assist PT Goal Formulation: With patient Time For Goal Achievement: 12/14/19 Potential to Achieve Goals: Good    Frequency BID   Barriers to discharge        Co-evaluation               AM-PAC PT "6 Clicks" Mobility  Outcome Measure Help needed turning from your back to your side while in a flat bed without using bedrails?: None Help needed moving from lying on your back to sitting on the side of a flat bed without using bedrails?: A Little Help needed moving to and from a bed to a chair (including a wheelchair)?: A Little Help needed standing up from a chair using your arms (e.g., wheelchair or bedside chair)?: A Little Help needed to walk in hospital room?: A Little Help needed climbing 3-5 steps with a railing? : A Little 6 Click Score: 19    End of Session   Activity Tolerance: Patient tolerated treatment well;Patient limited by fatigue;Patient limited by pain Patient left: in bed;with call bell/phone within reach Nurse Communication:  Mobility status PT Visit Diagnosis: Unsteadiness on feet (R26.81);Other abnormalities of gait and mobility (R26.89);Muscle weakness (generalized) (M62.81)    Time: 6812-7517 PT Time Calculation (min) (ACUTE ONLY): 31 min   Charges:   PT Evaluation $PT Eval Moderate Complexity: 1 Mod PT Treatments $Therapeutic Activity: 23-37 mins        3:21 PM, 12/12/19 Lonell Grandchild, MPT Physical Therapist with [facility] [phone] office [phone] mobile phone

## 2019-12-12 NOTE — Anesthesia Preprocedure Evaluation (Signed)
Anesthesia Evaluation  Patient identified by MRN, date of birth, ID band Patient awake    Reviewed: Allergy & Precautions, H&P , NPO status , Patient's Chart, lab work & pertinent test results, reviewed documented beta blocker date and time   Airway Mallampati: II  TM Distance: >3 FB Neck ROM: full    Dental no notable dental hx. (+) Edentulous Upper, Edentulous Lower   Pulmonary neg pulmonary ROS, former smoker,    Pulmonary exam normal breath sounds clear to auscultation       Cardiovascular Exercise Tolerance: Good hypertension, negative cardio ROS   Rhythm:regular Rate:Normal     Neuro/Psych negative neurological ROS  negative psych ROS   GI/Hepatic negative GI ROS, Neg liver ROS,   Endo/Other  negative endocrine ROSdiabetes  Renal/GU negative Renal ROS  negative genitourinary   Musculoskeletal   Abdominal   Peds  Hematology negative hematology ROS (+)   Anesthesia Other Findings   Reproductive/Obstetrics negative OB ROS                             Anesthesia Physical Anesthesia Plan  ASA: II  Anesthesia Plan: Spinal   Post-op Pain Management:    Induction:   PONV Risk Score and Plan: 2 and Propofol infusion  Airway Management Planned:   Additional Equipment:   Intra-op Plan:   Post-operative Plan:   Informed Consent: I have reviewed the patients History and Physical, chart, labs and discussed the procedure including the risks, benefits and alternatives for the proposed anesthesia with the patient or authorized representative who has indicated his/her understanding and acceptance.     Dental Advisory Given  Plan Discussed with: CRNA  Anesthesia Plan Comments:         Anesthesia Quick Evaluation

## 2019-12-13 ENCOUNTER — Encounter (HOSPITAL_COMMUNITY): Payer: Self-pay | Admitting: Orthopedic Surgery

## 2019-12-13 DIAGNOSIS — E119 Type 2 diabetes mellitus without complications: Secondary | ICD-10-CM | POA: Diagnosis not present

## 2019-12-13 DIAGNOSIS — D696 Thrombocytopenia, unspecified: Secondary | ICD-10-CM | POA: Diagnosis not present

## 2019-12-13 DIAGNOSIS — Z87891 Personal history of nicotine dependence: Secondary | ICD-10-CM | POA: Diagnosis not present

## 2019-12-13 DIAGNOSIS — Z79899 Other long term (current) drug therapy: Secondary | ICD-10-CM | POA: Diagnosis not present

## 2019-12-13 DIAGNOSIS — Z794 Long term (current) use of insulin: Secondary | ICD-10-CM | POA: Diagnosis not present

## 2019-12-13 DIAGNOSIS — Z96641 Presence of right artificial hip joint: Secondary | ICD-10-CM | POA: Diagnosis not present

## 2019-12-13 DIAGNOSIS — M1712 Unilateral primary osteoarthritis, left knee: Secondary | ICD-10-CM | POA: Diagnosis not present

## 2019-12-13 DIAGNOSIS — I10 Essential (primary) hypertension: Secondary | ICD-10-CM | POA: Diagnosis not present

## 2019-12-13 LAB — GLUCOSE, CAPILLARY
Glucose-Capillary: 154 mg/dL — ABNORMAL HIGH (ref 70–99)
Glucose-Capillary: 126 mg/dL — ABNORMAL HIGH (ref 70–99)

## 2019-12-13 LAB — CBC
HCT: 34.1 % — ABNORMAL LOW (ref 39.0–52.0)
Hemoglobin: 11 g/dL — ABNORMAL LOW (ref 13.0–17.0)
MCH: 27.6 pg (ref 26.0–34.0)
MCHC: 32.3 g/dL (ref 30.0–36.0)
MCV: 85.5 fL (ref 80.0–100.0)
Platelets: 117 10*3/uL — ABNORMAL LOW (ref 150–400)
RBC: 3.99 MIL/uL — ABNORMAL LOW (ref 4.22–5.81)
RDW: 13.8 % (ref 11.5–15.5)
WBC: 10.3 10*3/uL (ref 4.0–10.5)
nRBC: 0 % (ref 0.0–0.2)

## 2019-12-13 LAB — BASIC METABOLIC PANEL
Anion gap: 9 (ref 5–15)
BUN: 20 mg/dL (ref 8–23)
CO2: 26 mmol/L (ref 22–32)
Calcium: 8.3 mg/dL — ABNORMAL LOW (ref 8.9–10.3)
Chloride: 103 mmol/L (ref 98–111)
Creatinine, Ser: 1.28 mg/dL — ABNORMAL HIGH (ref 0.61–1.24)
GFR calc Af Amer: >60 mL/min (ref >60)
GFR calc non Af Amer: 55 mL/min — ABNORMAL LOW (ref >60)
Glucose, Bld: 197 mg/dL — ABNORMAL HIGH (ref 70–99)
Potassium: 4.5 mmol/L (ref 3.5–5.1)
Sodium: 138 mmol/L (ref 135–145)

## 2019-12-13 MED ORDER — TRAMADOL HCL 50 MG PO TABS: 50.0000 mg | ORAL_TABLET | Freq: Four times a day (QID) | ORAL | 3 refills | Status: AC

## 2019-12-13 MED ORDER — APIXABAN 2.5 MG PO TABS: 2.5000 mg | ORAL_TABLET | Freq: Two times a day (BID) | ORAL | 0 refills | Status: DC

## 2019-12-13 MED ORDER — POLYETHYLENE GLYCOL 3350 17 G PO PACK: 17.0000 g | PACK | Freq: Every day | ORAL | 0 refills | Status: DC

## 2019-12-13 MED ORDER — OXYCODONE-ACETAMINOPHEN 5-325 MG PO TABS
1.0000 | ORAL_TABLET | ORAL | Status: DC | PRN
  Administered 2019-12-13: 1 via ORAL
  Filled 2019-12-13: qty 1

## 2019-12-13 MED ORDER — BISACODYL 5 MG PO TBEC: 5.0000 mg | DELAYED_RELEASE_TABLET | Freq: Every day | ORAL | 0 refills | Status: DC | PRN

## 2019-12-13 MED ORDER — METHOCARBAMOL 500 MG PO TABS: 500.0000 mg | ORAL_TABLET | Freq: Four times a day (QID) | ORAL | 1 refills | Status: AC | PRN

## 2019-12-13 MED ORDER — OXYCODONE-ACETAMINOPHEN 5-325 MG PO TABS: 1.0000 | ORAL_TABLET | ORAL | 0 refills | Status: AC | PRN

## 2019-12-13 NOTE — Progress Notes (Signed)
Patient ID: Donald Mclaughlin, male   DOB: 09-07-1945, 74 y.o.   MRN: [id number] POD 1 LEFT TKA   BP 115/66 (BP Location: Left Arm)   Pulse 67   Temp 98.1 F (36.7 C) (Oral)   Resp 20   SpO2 99%   BMP Latest Ref Rng & Units 12/13/2019 12/08/2019 08/27/2008  Glucose 70 - 99 mg/dL 056(R) 889(B) 388(U)  BUN 8 - 23 mg/dL 20 16 11   Creatinine 0.61 - 1.24 mg/dL ) 6.66(A) 4.86(N  Sodium 135 - 145 mmol/L 138 136 140  Potassium 3.5 - 5.1 mmol/L 4.5 3.5 3.9  Chloride 98 - 111 mmol/L 103 105 113(H)  CO2 22 - 32 mmol/L 26 22 22   Calcium 8.9 - 10.3 mg/dL 8.3(L) 8.9 8.5   CBC Latest Ref Rng & Units 12/13/2019 12/08/2019 08/27/2008  WBC 4.0 - 10.5 K/uL 10.3 4.8 4.6  Hemoglobin 13.0 - 17.0 g/dL 11.0(L) 12.6(L) 13.3  Hematocrit 39 - 52 % 34.1(L) 39.5 39.4  Platelets 150 - 400 K/uL 117(L) 143(L) 97(L)    LEFT KNEE ROM:  0 - 95 degrees AMBULATION DISTANCE: 50 feet using RW with Min Guard Assist     DRESSING CHANGED   C/O PAIN MEDS NOT STRONG ENOUGH, HAS HAD PRE ADMIT OPIOIDS TO CONTROL PAIN   SUGAR RUNNING A BIT HIGH BUT HES ON HIS PRE ADMIT AMOUNT OF INSULIN , NEEDS PMG TO ADJUST AS OUTPATIENT

## 2019-12-13 NOTE — Discharge Instructions (Signed)
See primary care doctor over the next 2 weeks to monitor glucose

## 2019-12-13 NOTE — Progress Notes (Signed)
Physical Therapy Treatment Patient Details Name: Donald Mclaughlin MRN: [id number] DOB: [birth year] Today's Date: 12/13/2019  LEFT KNEE ROM: 0 - 90 degrees AMBULATION DISTANCE: 100 feet using RW with Mod Independence/Supervision    History of Present Illness Donald Mclaughlin is a 74 year old male status post Left TKA on 12/12/19 with diagnosis of OA left knee    PT Comments    Patient demonstrates increased endurance/distance for gait training with fair/good return for left heel to toe stepping without loss of balance, limited secondary to c/o fatigue and left knee pain, good return for going up down steps in stairwell with understanding acknowledge and tolerated sitting up in chair with LLE dangling after therapy.  Plan:  Patient to be discharged home today with recommendations below.   Follow Up Recommendations  Home health PT;Supervision - Intermittent;Supervision for mobility/OOB     Equipment Recommendations  Rolling walker with 5" wheels    Recommendations for Other Services       Precautions / Restrictions Precautions Precautions: Fall Restrictions Weight Bearing Restrictions: Yes LLE Weight Bearing: Weight bearing as tolerated Other Position/Activity Restrictions: don't put pillows under left knee    Mobility  Bed Mobility Overal bed mobility: Modified Independent Bed Mobility: Supine to Sit           General bed mobility comments: slightly labored movement  Transfers Overall transfer level: Needs assistance Equipment used: Rolling walker (2 wheeled) Transfers: Sit to/from [employer] Sit to Stand: Modified independent (Device/Increase time);Supervision Stand pivot transfers: Supervision       General transfer comment: occasional verbal cues for proper hand placement during sit to stands/transfers  Ambulation/Gait Ambulation/Gait assistance: Supervision;Modified independent (Device/Increase time) Gait Distance (Feet): 100 Feet Assistive device:  Rolling walker (2 wheeled) Gait Pattern/deviations: Decreased step length - left;Decreased stance time - left;Decreased stride length;Antalgic Gait velocity: decreased   General Gait Details: increased endurance/distance for ambulation with fair/good return for left heel to toe stepping without loss of balance   Stairs Stairs: Yes Stairs assistance: Modified independent (Device/Increase time);Supervision Stair Management: Two rails;Step to pattern Number of Stairs: 2 General stair comments: demonstrates good return for stepping up/down steps in stair using bilateral siderails without loss of balance   Wheelchair Mobility    Modified Rankin (Stroke Patients Only)       Balance Overall balance assessment: Needs assistance Sitting-balance support: Feet supported;No upper extremity supported Sitting balance-Leahy Scale: Good Sitting balance - Comments: seated at EOB   Standing balance support: During functional activity;Bilateral upper extremity supported Standing balance-Leahy Scale: Fair Standing balance comment: fair/good using RW                            Cognition Arousal/Alertness: Awake/alert Behavior During Therapy: WFL for tasks assessed/performed Overall Cognitive Status: Within Functional Limits for tasks assessed                                        Exercises Total Joint Exercises Ankle Circles/Pumps: Supine;10 reps;Left;AROM;Strengthening Quad Sets: Supine;10 reps;Left;AROM;Strengthening Short Arc Quad: Supine;10 reps;Left;Strengthening;AROM Heel Slides: Supine;10 reps;Left;Strengthening;AROM Goniometric ROM: left knee: 0-90 degrees    General Comments        Pertinent Vitals/Pain Pain Score: 4  Pain Location: left knee Pain Descriptors / Indicators: Sore;Discomfort Pain Intervention(s): Limited activity within patient's tolerance;Monitored during session;Repositioned;Premedicated before session    Home Living  Prior Function            PT Goals (current goals can now be found in the care plan section) Acute Rehab PT Goals Patient Stated Goal: return home with family to assist PT Goal Formulation: With patient Time For Goal Achievement: 12/14/19 Potential to Achieve Goals: Good Progress towards PT goals: Progressing toward goals    Frequency    BID      PT Plan Current plan remains appropriate    Co-evaluation              AM-PAC PT "6 Clicks" Mobility   Outcome Measure  Help needed turning from your back to your side while in a flat bed without using bedrails?: None Help needed moving from lying on your back to sitting on the side of a flat bed without using bedrails?: None Help needed moving to and from a bed to a chair (including a wheelchair)?: A Little Help needed standing up from a chair using your arms (e.g., wheelchair or bedside chair)?: A Little Help needed to walk in hospital room?: A Little Help needed climbing 3-5 steps with a railing? : A Little 6 Click Score: 20    End of Session   Activity Tolerance: Patient tolerated treatment well;Patient limited by fatigue;Patient limited by pain Patient left: in chair;with call bell/phone within reach Nurse Communication: Mobility status PT Visit Diagnosis: Unsteadiness on feet (R26.81);Other abnormalities of gait and mobility (R26.89);Muscle weakness (generalized) (M62.81)     Time: 7035-0093 PT Time Calculation (min) (ACUTE ONLY): 33 min  Charges:  $Gait Training: 8-22 mins $Therapeutic Exercise: 8-22 mins                     1:49 PM, 12/13/19 Ocie Bob, MPT Physical Therapist with [facility] [phone] office [phone] mobile phone

## 2019-12-13 NOTE — Progress Notes (Signed)
NURSING PROGRESS NOTE  Donald Mclaughlin [id number] Discharge Data: 12/13/2019 11:22 AM Attending Provider: Vickki Hearing, MD VOH:[id number], Ninetta Lights, MD     [name] to be D/C'd Home per MD order.  Discussed with the patient the After Visit Summary and all questions fully answered. All IV's discontinued with no bleeding noted. All belongings returned to patient for patient to take home.   Last Vital Signs:  Blood pressure 106/65, pulse 68, temperature 98.1 F (36.7 C), temperature source Oral, resp. rate 16, SpO2 100 %.  Discharge Medication List Allergies as of 12/13/2019   No Known Allergies     Medication List    STOP taking these medications   HYDROcodone-acetaminophen 5-325 MG tablet Commonly known as: NORCO/VICODIN     TAKE these medications   apixaban 2.5 MG Tabs tablet Commonly known as: Eliquis Take 1 tablet (2.5 mg total) by mouth 2 (two) times daily.   bisacodyl 5 MG EC tablet Commonly known as: DULCOLAX Take 1 tablet (5 mg total) by mouth daily as needed for moderate constipation.   lisinopril 20 MG tablet Commonly known as: ZESTRIL Take 20 mg by mouth daily. Notes to patient: Next dose tomorrow morning   methocarbamol 500 MG tablet Commonly known as: ROBAXIN Take 1 tablet (500 mg total) by mouth every 6 (six) hours as needed for up to 14 days for muscle spasms.   naproxen 500 MG tablet Commonly known as: NAPROSYN Take 1 tablet (500 mg total) by mouth 2 (two) times daily with a meal.   oxyCODONE-acetaminophen 5-325 MG tablet Commonly known as: PERCOCET/ROXICET Take 1 tablet by mouth every 4 (four) hours as needed for up to 7 days for severe pain.   polyethylene glycol 17 g packet Commonly known as: MIRALAX / GLYCOLAX Take 17 g by mouth daily. Notes to patient: Next dose tomorrow morning   simvastatin 20 MG tablet Commonly known as: ZOCOR Take 20 mg by mouth daily. Notes to patient: Next dose tonight   Toujeo SoloStar 300 UNIT/ML Solostar  Pen Generic drug: insulin glargine (1 Unit Dial) Inject 28 Units into the skin daily.   traMADol 50 MG tablet Commonly known as: ULTRAM Take 1 tablet (50 mg total) by mouth every 6 (six) hours for 7 days. Notes to patient: Next dose this evening at 5 pm            Durable Medical Equipment  (From admission, onward)         Start     Ordered   12/12/19 1540  For home use only DME Walker rolling  Once       Question Answer Comment  Walker: With 5 Inch Wheels   Patient needs a walker to treat with the following condition History of left knee replacement      12/12/19 1540           Roma Kayser, Charity fundraiser

## 2019-12-13 NOTE — Discharge Summary (Signed)
Physician Discharge Summary  Patient ID: Donald Mclaughlin MRN: [id number] DOB/AGE: February 27, 1946 74 y.o.  Admit date: 12/12/2019 Discharge date: 12/13/2019  Admission Diagnoses: Osteoarthritis left knee  Discharge Diagnoses: Same Active Problems:   Primary osteoarthritis of left knee   Discharged Condition: Stable  Hospital Course:   December 12, 2019 left total knee no complications spinal anesthetic, no nerve block Physical therapy initiated, did well  December 13, 2019 stable for discharge Significant Diagnostic Studies:   CBC Latest Ref Rng & Units 12/13/2019 12/08/2019 08/27/2008  WBC 4.0 - 10.5 K/uL 10.3 4.8 4.6  Hemoglobin 13.0 - 17.0 g/dL 11.0(L) 12.6(L) 13.3  Hematocrit 39 - 52 % 34.1(L) 39.5 39.4  Platelets 150 - 400 K/uL 117(L) 143(L) 97(L)   BMP Latest Ref Rng & Units 12/13/2019 12/08/2019 08/27/2008  Glucose 70 - 99 mg/dL 846(K) 599(J) 570(V)  BUN 8 - 23 mg/dL 20 16 11   Creatinine 0.61 - 1.24 mg/dL ) 7.79(T) 9.03(E  Sodium 135 - 145 mmol/L 138 136 140  Potassium 3.5 - 5.1 mmol/L 4.5 3.5 3.9  Chloride 98 - 111 mmol/L 103 105 113(H)  CO2 22 - 32 mmol/L 26 22 22   Calcium 8.9 - 10.3 mg/dL 8.3(L) 8.9 8.5    Discharge Exam: Blood pressure 115/66, pulse 67, temperature 98.1 F (36.7 C), temperature source Oral, resp. rate 20, SpO2 99 %. Awake alert oriented x3  Left leg dressing change sanguinous drainage dry after dressing changed  Neurovascular exam intact  LEFT KNEE ROM:  0 - 95 degrees AMBULATION DISTANCE: 50 feet using RW with Min Guard Assist        Disposition: Discharge disposition: 01-Home or Self Care       Discharge Instructions    Call MD / Call 911   Complete by: As directed    If you experience chest pain or shortness of breath, CALL 911 and be transported to the hospital emergency room.  If you develope a fever above 101 F, pus (white drainage) or increased drainage or redness at the wound, or calf pain, call your surgeon's office.   Constipation  Prevention   Complete by: As directed    Drink plenty of fluids.  Prune juice may be helpful.  You may use a stool softener, such as Colace (over the counter) 100 mg twice a day.  Use MiraLax (over the counter) for constipation as needed.   Diet - low sodium heart healthy   Complete by: As directed    Discharge instructions   Complete by: As directed    Ice the knee using the blue pad 6 times a day for 30 minutes  Knee machine (CPM) 0 to 80 degrees 4 hours a day increase 10 degrees/day until reaching 120 degrees, you will use the machine for 2 weeks and then it will be returned to the company  Blood thinner to prevent blood clots: Take for 30 days: Eliquis.  Blue heel pillow called bone foam use 4 times a day for 20 minutes   Increase activity slowly as tolerated   Complete by: As directed      Allergies as of 12/13/2019   No Known Allergies     Medication List    STOP taking these medications   HYDROcodone-acetaminophen 5-325 MG tablet Commonly known as: NORCO/VICODIN     TAKE these medications   apixaban 2.5 MG Tabs tablet Commonly known as: Eliquis Take 1 tablet (2.5 mg total) by mouth 2 (two) times daily.   bisacodyl 5 MG EC tablet Commonly  known as: DULCOLAX Take 1 tablet (5 mg total) by mouth daily as needed for moderate constipation.   lisinopril 20 MG tablet Commonly known as: ZESTRIL Take 20 mg by mouth daily.   methocarbamol 500 MG tablet Commonly known as: ROBAXIN Take 1 tablet (500 mg total) by mouth every 6 (six) hours as needed for up to 14 days for muscle spasms.   naproxen 500 MG tablet Commonly known as: NAPROSYN Take 1 tablet (500 mg total) by mouth 2 (two) times daily with a meal.   oxyCODONE-acetaminophen 5-325 MG tablet Commonly known as: PERCOCET/ROXICET Take 1 tablet by mouth every 4 (four) hours as needed for up to 7 days for severe pain.   polyethylene glycol 17 g packet Commonly known as: MIRALAX / GLYCOLAX Take 17 g by mouth daily.    simvastatin 20 MG tablet Commonly known as: ZOCOR Take 20 mg by mouth daily.   Toujeo SoloStar 300 UNIT/ML Solostar Pen Generic drug: insulin glargine (1 Unit Dial) Inject 28 Units into the skin daily.   traMADol 50 MG tablet Commonly known as: ULTRAM Take 1 tablet (50 mg total) by mouth every 6 (six) hours for 7 days.            Durable Medical Equipment  (From admission, onward)         Start     Ordered   12/12/19 1540  For home use only DME Walker rolling  Once       Question Answer Comment  Walker: With 5 Inch Wheels   Patient needs a walker to treat with the following condition History of left knee replacement      12/12/19 1540           Signed: Arther Abbott 12/13/2019, 7:55 AM

## 2019-12-13 NOTE — Progress Notes (Signed)
Refused SCDs. Pt educated on importance and cont to refuse.

## 2019-12-13 NOTE — Clinical Social Work Note (Signed)
RW delivered to room. Tim with [facility] notified of discharge. TOC signing off.   Estephan Gallardo, Juleen China, LCSW

## 2019-12-14 ENCOUNTER — Telehealth: Payer: Self-pay | Admitting: Orthopedic Surgery

## 2019-12-14 DIAGNOSIS — Z96652 Presence of left artificial knee joint: Secondary | ICD-10-CM | POA: Diagnosis not present

## 2019-12-14 DIAGNOSIS — G8929 Other chronic pain: Secondary | ICD-10-CM

## 2019-12-14 DIAGNOSIS — M1712 Unilateral primary osteoarthritis, left knee: Secondary | ICD-10-CM

## 2019-12-14 NOTE — Telephone Encounter (Signed)
Patient's daughter Haywood Lasso called stating that Donald Mclaughlin toilet seat is kind of low for him to sit on. She wanted to know if there was some kind of extension that they can get to raise the height of the seat or if he can get an order for a bedside commode.  Please advise  Thanks

## 2019-12-14 NOTE — Telephone Encounter (Signed)
Have called, she wants order sent to [facility] home care/ faxed

## 2019-12-15 DIAGNOSIS — I10 Essential (primary) hypertension: Secondary | ICD-10-CM | POA: Diagnosis not present

## 2019-12-15 DIAGNOSIS — E1165 Type 2 diabetes mellitus with hyperglycemia: Secondary | ICD-10-CM | POA: Diagnosis not present

## 2019-12-16 DIAGNOSIS — D696 Thrombocytopenia, unspecified: Secondary | ICD-10-CM | POA: Diagnosis not present

## 2019-12-16 DIAGNOSIS — Z471 Aftercare following joint replacement surgery: Secondary | ICD-10-CM | POA: Diagnosis not present

## 2019-12-16 DIAGNOSIS — E119 Type 2 diabetes mellitus without complications: Secondary | ICD-10-CM | POA: Diagnosis not present

## 2019-12-16 DIAGNOSIS — Z96642 Presence of left artificial hip joint: Secondary | ICD-10-CM | POA: Diagnosis not present

## 2019-12-16 DIAGNOSIS — Z7901 Long term (current) use of anticoagulants: Secondary | ICD-10-CM | POA: Diagnosis not present

## 2019-12-16 DIAGNOSIS — Z794 Long term (current) use of insulin: Secondary | ICD-10-CM | POA: Diagnosis not present

## 2019-12-16 DIAGNOSIS — Z96652 Presence of left artificial knee joint: Secondary | ICD-10-CM | POA: Diagnosis not present

## 2019-12-16 DIAGNOSIS — Z87891 Personal history of nicotine dependence: Secondary | ICD-10-CM | POA: Diagnosis not present

## 2019-12-16 DIAGNOSIS — I10 Essential (primary) hypertension: Secondary | ICD-10-CM | POA: Diagnosis not present

## 2019-12-18 DIAGNOSIS — I10 Essential (primary) hypertension: Secondary | ICD-10-CM | POA: Diagnosis not present

## 2019-12-18 DIAGNOSIS — Z794 Long term (current) use of insulin: Secondary | ICD-10-CM | POA: Diagnosis not present

## 2019-12-18 DIAGNOSIS — D696 Thrombocytopenia, unspecified: Secondary | ICD-10-CM | POA: Diagnosis not present

## 2019-12-18 DIAGNOSIS — Z96652 Presence of left artificial knee joint: Secondary | ICD-10-CM | POA: Diagnosis not present

## 2019-12-18 DIAGNOSIS — Z471 Aftercare following joint replacement surgery: Secondary | ICD-10-CM | POA: Diagnosis not present

## 2019-12-18 DIAGNOSIS — Z87891 Personal history of nicotine dependence: Secondary | ICD-10-CM | POA: Diagnosis not present

## 2019-12-18 DIAGNOSIS — Z96642 Presence of left artificial hip joint: Secondary | ICD-10-CM | POA: Diagnosis not present

## 2019-12-18 DIAGNOSIS — E119 Type 2 diabetes mellitus without complications: Secondary | ICD-10-CM | POA: Diagnosis not present

## 2019-12-18 DIAGNOSIS — Z7901 Long term (current) use of anticoagulants: Secondary | ICD-10-CM | POA: Diagnosis not present

## 2019-12-18 LAB — TYPE AND SCREEN
ABO/RH(D): A POS
Antibody Screen: NEGATIVE
Unit division: 0
Unit division: 0

## 2019-12-18 LAB — BPAM RBC
Blood Product Expiration Date: 202107042359
Blood Product Expiration Date: 202107062359
Unit Type and Rh: 6200
Unit Type and Rh: 6200

## 2019-12-19 ENCOUNTER — Telehealth: Payer: Self-pay | Admitting: Orthopedic Surgery

## 2019-12-19 NOTE — Telephone Encounter (Signed)
Form for patient's daughter/designated contact Lynnette Arshad' [employer] re-faxed (initially faxed 12/01/19 as noted/original mailed as noted by [employer]) - to [employer] Fax# [phone]; aware.

## 2019-12-20 DIAGNOSIS — Z87891 Personal history of nicotine dependence: Secondary | ICD-10-CM | POA: Diagnosis not present

## 2019-12-20 DIAGNOSIS — Z794 Long term (current) use of insulin: Secondary | ICD-10-CM | POA: Diagnosis not present

## 2019-12-20 DIAGNOSIS — D696 Thrombocytopenia, unspecified: Secondary | ICD-10-CM | POA: Diagnosis not present

## 2019-12-20 DIAGNOSIS — E119 Type 2 diabetes mellitus without complications: Secondary | ICD-10-CM | POA: Diagnosis not present

## 2019-12-20 DIAGNOSIS — Z96642 Presence of left artificial hip joint: Secondary | ICD-10-CM | POA: Diagnosis not present

## 2019-12-20 DIAGNOSIS — I10 Essential (primary) hypertension: Secondary | ICD-10-CM | POA: Diagnosis not present

## 2019-12-20 DIAGNOSIS — Z471 Aftercare following joint replacement surgery: Secondary | ICD-10-CM | POA: Diagnosis not present

## 2019-12-20 DIAGNOSIS — Z96652 Presence of left artificial knee joint: Secondary | ICD-10-CM | POA: Diagnosis not present

## 2019-12-20 DIAGNOSIS — Z7901 Long term (current) use of anticoagulants: Secondary | ICD-10-CM | POA: Diagnosis not present

## 2019-12-22 ENCOUNTER — Other Ambulatory Visit: Payer: Self-pay | Admitting: Orthopedic Surgery

## 2019-12-22 ENCOUNTER — Telehealth: Payer: Self-pay | Admitting: Radiology

## 2019-12-22 DIAGNOSIS — Z7901 Long term (current) use of anticoagulants: Secondary | ICD-10-CM | POA: Diagnosis not present

## 2019-12-22 DIAGNOSIS — I10 Essential (primary) hypertension: Secondary | ICD-10-CM | POA: Diagnosis not present

## 2019-12-22 DIAGNOSIS — D696 Thrombocytopenia, unspecified: Secondary | ICD-10-CM | POA: Diagnosis not present

## 2019-12-22 DIAGNOSIS — E119 Type 2 diabetes mellitus without complications: Secondary | ICD-10-CM | POA: Diagnosis not present

## 2019-12-22 DIAGNOSIS — Z87891 Personal history of nicotine dependence: Secondary | ICD-10-CM | POA: Diagnosis not present

## 2019-12-22 DIAGNOSIS — Z471 Aftercare following joint replacement surgery: Secondary | ICD-10-CM | POA: Diagnosis not present

## 2019-12-22 DIAGNOSIS — Z96642 Presence of left artificial hip joint: Secondary | ICD-10-CM | POA: Diagnosis not present

## 2019-12-22 DIAGNOSIS — Z96652 Presence of left artificial knee joint: Secondary | ICD-10-CM | POA: Diagnosis not present

## 2019-12-22 DIAGNOSIS — G8918 Other acute postprocedural pain: Secondary | ICD-10-CM

## 2019-12-22 DIAGNOSIS — Z794 Long term (current) use of insulin: Secondary | ICD-10-CM | POA: Diagnosis not present

## 2019-12-22 MED ORDER — TRAMADOL HCL 50 MG PO TABS: 50.0000 mg | ORAL_TABLET | Freq: Four times a day (QID) | ORAL | 5 refills | Status: DC | PRN

## 2019-12-22 MED ORDER — OXYCODONE-ACETAMINOPHEN 5-325 MG PO TABS: 1.0000 | ORAL_TABLET | Freq: Four times a day (QID) | ORAL | 0 refills | Status: AC | PRN

## 2019-12-22 NOTE — Progress Notes (Signed)
Meds ordered this encounter  Medications  . traMADol (ULTRAM) 50 MG tablet    Sig: Take 1 tablet (50 mg total) by mouth every 6 (six) hours as needed.    Dispense:  60 tablet    Refill:  5  . oxyCODONE-acetaminophen (PERCOCET/ROXICET) 5-325 MG tablet    Sig: Take 1 tablet by mouth every 6 (six) hours as needed for up to 7 days for severe pain.    Dispense:  28 tablet    Refill:  0

## 2019-12-22 NOTE — Telephone Encounter (Signed)
Patient called and asked for refill of pain meds, postop TKA 12/12/2019, uses [employer] advertising for pharmacy.    I did not see a medication in system to start refill on.

## 2019-12-25 ENCOUNTER — Other Ambulatory Visit: Payer: Self-pay | Admitting: Orthopedic Surgery

## 2019-12-25 ENCOUNTER — Telehealth: Payer: Self-pay | Admitting: Orthopedic Surgery

## 2019-12-25 DIAGNOSIS — D696 Thrombocytopenia, unspecified: Secondary | ICD-10-CM | POA: Diagnosis not present

## 2019-12-25 DIAGNOSIS — Z96652 Presence of left artificial knee joint: Secondary | ICD-10-CM | POA: Diagnosis not present

## 2019-12-25 DIAGNOSIS — Z87891 Personal history of nicotine dependence: Secondary | ICD-10-CM | POA: Diagnosis not present

## 2019-12-25 DIAGNOSIS — E119 Type 2 diabetes mellitus without complications: Secondary | ICD-10-CM | POA: Diagnosis not present

## 2019-12-25 DIAGNOSIS — Z7901 Long term (current) use of anticoagulants: Secondary | ICD-10-CM | POA: Diagnosis not present

## 2019-12-25 DIAGNOSIS — Z96642 Presence of left artificial hip joint: Secondary | ICD-10-CM | POA: Diagnosis not present

## 2019-12-25 DIAGNOSIS — Z471 Aftercare following joint replacement surgery: Secondary | ICD-10-CM | POA: Diagnosis not present

## 2019-12-25 DIAGNOSIS — I10 Essential (primary) hypertension: Secondary | ICD-10-CM | POA: Diagnosis not present

## 2019-12-25 DIAGNOSIS — G8918 Other acute postprocedural pain: Secondary | ICD-10-CM

## 2019-12-25 DIAGNOSIS — Z794 Long term (current) use of insulin: Secondary | ICD-10-CM | POA: Diagnosis not present

## 2019-12-25 NOTE — Telephone Encounter (Signed)
Voice message received from patient's designated contact on file, daughter Enzo Treu, asking if anything else can be prescribed, since original refill request was denied due to "too early", until patient's appointment tomorrow, 12/26/19, for post op visit/suture removal. Please call [phone]

## 2019-12-25 NOTE — Telephone Encounter (Signed)
Rx was sent inthis weekend

## 2019-12-25 NOTE — Telephone Encounter (Signed)
I have advised ice Use Tramadol since he is out of Percocet She voiced understanding

## 2019-12-26 ENCOUNTER — Encounter: Payer: Self-pay | Admitting: Orthopedic Surgery

## 2019-12-26 ENCOUNTER — Other Ambulatory Visit: Payer: Self-pay

## 2019-12-26 ENCOUNTER — Other Ambulatory Visit: Payer: Self-pay | Admitting: Orthopedic Surgery

## 2019-12-26 ENCOUNTER — Ambulatory Visit (INDEPENDENT_AMBULATORY_CARE_PROVIDER_SITE_OTHER): Payer: Medicare Other | Admitting: Orthopedic Surgery

## 2019-12-26 ENCOUNTER — Other Ambulatory Visit: Payer: Self-pay | Admitting: Radiology

## 2019-12-26 DIAGNOSIS — G8918 Other acute postprocedural pain: Secondary | ICD-10-CM

## 2019-12-26 DIAGNOSIS — M1712 Unilateral primary osteoarthritis, left knee: Secondary | ICD-10-CM

## 2019-12-26 DIAGNOSIS — Z96652 Presence of left artificial knee joint: Secondary | ICD-10-CM

## 2019-12-26 MED ORDER — HYDROCODONE-ACETAMINOPHEN 10-325 MG PO TABS: 1.0000 | ORAL_TABLET | ORAL | 0 refills | Status: DC | PRN

## 2019-12-26 NOTE — Patient Instructions (Signed)
Call for therapy appointment [phone]

## 2019-12-26 NOTE — Telephone Encounter (Signed)
I will take out staples today when he comes in Will you refill Oxycodone? He ran out early over the weekend we spoke yesterday about it.

## 2019-12-26 NOTE — Progress Notes (Signed)
Patient presents for staple removal s/p total knee replacement left 12/12/19 (14 days post op). Staples removed without difficulty, patient tolerated well. No redness or drainage noted from incision. Patient advised ok to shower, soap and water only on incision. Continue TED  Hose for 2 more weeks.   Physical therapy has advised patient ready for OPPT starting next week , orders placed  Return office visit in 2 weeks, as home therapy concerned about flexion only 0-83 degrees. Patient encouraged to ice, use CPM, and work hard in therapy, need to increase flexion, he voiced understanding.

## 2019-12-26 NOTE — Progress Notes (Signed)
Meds ordered this encounter  Medications   HYDROcodone-acetaminophen (NORCO) 10-325 MG tablet    Sig: Take 1 tablet by mouth every 4 (four) hours as needed.    Dispense:  42 tablet    Refill:  0   ' 

## 2019-12-27 DIAGNOSIS — Z471 Aftercare following joint replacement surgery: Secondary | ICD-10-CM | POA: Diagnosis not present

## 2019-12-27 DIAGNOSIS — Z87891 Personal history of nicotine dependence: Secondary | ICD-10-CM | POA: Diagnosis not present

## 2019-12-27 DIAGNOSIS — D696 Thrombocytopenia, unspecified: Secondary | ICD-10-CM | POA: Diagnosis not present

## 2019-12-27 DIAGNOSIS — Z794 Long term (current) use of insulin: Secondary | ICD-10-CM | POA: Diagnosis not present

## 2019-12-27 DIAGNOSIS — I10 Essential (primary) hypertension: Secondary | ICD-10-CM | POA: Diagnosis not present

## 2019-12-27 DIAGNOSIS — Z96642 Presence of left artificial hip joint: Secondary | ICD-10-CM | POA: Diagnosis not present

## 2019-12-27 DIAGNOSIS — Z96652 Presence of left artificial knee joint: Secondary | ICD-10-CM | POA: Diagnosis not present

## 2019-12-27 DIAGNOSIS — E119 Type 2 diabetes mellitus without complications: Secondary | ICD-10-CM | POA: Diagnosis not present

## 2019-12-27 DIAGNOSIS — Z7901 Long term (current) use of anticoagulants: Secondary | ICD-10-CM | POA: Diagnosis not present

## 2019-12-29 DIAGNOSIS — Z87891 Personal history of nicotine dependence: Secondary | ICD-10-CM | POA: Diagnosis not present

## 2019-12-29 DIAGNOSIS — Z471 Aftercare following joint replacement surgery: Secondary | ICD-10-CM | POA: Diagnosis not present

## 2019-12-29 DIAGNOSIS — Z96652 Presence of left artificial knee joint: Secondary | ICD-10-CM | POA: Diagnosis not present

## 2019-12-29 DIAGNOSIS — Z96642 Presence of left artificial hip joint: Secondary | ICD-10-CM | POA: Diagnosis not present

## 2019-12-29 DIAGNOSIS — D696 Thrombocytopenia, unspecified: Secondary | ICD-10-CM | POA: Diagnosis not present

## 2019-12-29 DIAGNOSIS — I10 Essential (primary) hypertension: Secondary | ICD-10-CM | POA: Diagnosis not present

## 2019-12-29 DIAGNOSIS — E119 Type 2 diabetes mellitus without complications: Secondary | ICD-10-CM | POA: Diagnosis not present

## 2019-12-29 DIAGNOSIS — Z794 Long term (current) use of insulin: Secondary | ICD-10-CM | POA: Diagnosis not present

## 2019-12-29 DIAGNOSIS — Z7901 Long term (current) use of anticoagulants: Secondary | ICD-10-CM | POA: Diagnosis not present

## 2020-01-03 ENCOUNTER — Ambulatory Visit: Payer: Medicare Other | Attending: Orthopedic Surgery | Admitting: Physical Therapy

## 2020-01-03 ENCOUNTER — Other Ambulatory Visit: Payer: Self-pay

## 2020-01-03 DIAGNOSIS — M25662 Stiffness of left knee, not elsewhere classified: Secondary | ICD-10-CM | POA: Diagnosis not present

## 2020-01-03 DIAGNOSIS — M25562 Pain in left knee: Secondary | ICD-10-CM | POA: Diagnosis not present

## 2020-01-03 DIAGNOSIS — G8929 Other chronic pain: Secondary | ICD-10-CM | POA: Diagnosis not present

## 2020-01-03 DIAGNOSIS — M6281 Muscle weakness (generalized): Secondary | ICD-10-CM

## 2020-01-03 DIAGNOSIS — R600 Localized edema: Secondary | ICD-10-CM | POA: Diagnosis not present

## 2020-01-03 NOTE — Therapy (Signed)
[facility] [address] [place], [place], [address] Phone: [phone]   Fax:  [phone]  Physical Therapy Evaluation  Patient Details  Name: Donald Mclaughlin MRN: [id number] Date of Birth: 22-Mar-1946 Referring Provider (PT): Fuller Canada MD   Encounter Date: 01/03/2020   PT End of Session - 01/03/20 1417    Visit Number 1    Number of Visits 18    Date for PT Re-Evaluation 02/14/20    Authorization Type FOTO.    PT Start Time 0107    PT Stop Time 0148    PT Time Calculation (min) 41 min    Activity Tolerance Patient tolerated treatment well    Behavior During Therapy WFL for tasks assessed/performed           Past Medical History:  Diagnosis Date  . Arthritis   . Diabetes mellitus without complication (HCC)   . High blood pressure   . Thrombocytopenia (HCC) 12/31/2015    Past Surgical History:  Procedure Laterality Date  . HIP SURGERY Right 2013   arthroplasty  . TOTAL KNEE ARTHROPLASTY Left 12/12/2019   Procedure: TOTAL KNEE ARTHROPLASTY/ Left;  Surgeon: Vickki Hearing, MD;  Location: [facility];  Service: Orthopedics;  Laterality: Left;    There were no vitals filed for this visit.    Subjective Assessment - 01/03/20 1419    Subjective COVID-19 screen performed prior to patient entering clinic.  The patient underwent a left total knee replacement on 12/12/19.  He has moderate pain currently.  He is walking safely with a FWW.  He is complinat to wearing his TED hose.    Pertinent History OA, right hip surgery, HTN, thrombocytopenia.              [facility] PT Assessment - 01/03/20 0001      Assessment   Medical Diagnosis Left total knee replacement.    Referring Provider (PT) Fuller Canada MD    Onset Date/Surgical Date --   12/12/19 (surgery date).     Precautions   Precautions --   No ultrasound.     Restrictions   Weight Bearing Restrictions No      Balance Screen   Has the patient fallen in the past 6  months No    Has the patient had a decrease in activity level because of a fear of falling?  Yes    Is the patient reluctant to leave their home because of a fear of falling?  No      Home Environment   Living Environment Private residence      Prior Function   Level of Independence Independent      Observation/Other Assessments   Observations Left knee incisional site appears to be healing well.    Focus on Therapeutic Outcomes (FOTO)  63% limitation.      Observation/Other Assessments-Edema    Edema Circumferential      Circumferential Edema   Circumferential - Right LT 7 cms > RT.      ROM / Strength   AROM / PROM / Strength AROM;Strength      AROM   Overall AROM Comments -12 degrees active left knee exyension and passive to -9 degrees with active flexion to 83 degrees and passive to 89 degrees.      Strength   Overall Strength Comments Left hip flexion and left knee extension= 4-/5.      Palpation   Palpation comment Diffuse anterior left knee pain currently.  Ambulation/Gait   Gait Comments The patient is walking safely with a FWW.                      Objective measurements completed on examination: See above findings.       [facility] Adult PT Treatment/Exercise - 01/03/20 0001      Exercises   Exercises Knee/Hip      Knee/Hip Exercises: Aerobic   Nustep Level 1 x 8 minutes.      Modalities   Modalities Vasopneumatic      Vasopneumatic   Number Minutes Vasopneumatic  10 minutes    Vasopnuematic Location  --   Left knee.   Vasopneumatic Pressure Low                       PT Long Term Goals - 01/03/20 1434      PT LONG TERM GOAL #1   Title Independent with a HEP.    Time 6    Period Weeks    Status New      PT LONG TERM GOAL #2   Title Full right active knee extension in order to normalize gait.    Time 6    Period Weeks    Status New      PT LONG TERM GOAL #3   Title Active knee flexion to 115 degrees+ so the  patient can perform functional tasks and do so with pain not > 2-3/10.    Time 6    Period Weeks    Status New      PT LONG TERM GOAL #4   Title Increase right knee strength to a solid 4+/5 to provide good stability for accomplishment of functional activities.    Time 6    Period Weeks    Status New      PT LONG TERM GOAL #5   Title Perform a reciprocating stair gait with one railing with pain not > 2-3/10.    Time 6    Period Weeks    Status New                  Plan - 01/03/20 1427    Clinical Impression Statement The patient presents to OPPT s/p left total knee performed on 12/12/19.  He currently right knee flexion and extension.  He has a significant amount of right knee edema.  He is compliant to wearing his TED hose.  He is walking safely with a FWW.  His FOTO limitation score demonstrates a 63% limitation.  Patient will benefit from skilled physical therapy intervention to address deficits and pain.    Personal Factors and Comorbidities Comorbidity 1;Comorbidity 2    Comorbidities OA, right hip surgery, HTN, thrombocytopenia.    Examination-Activity Limitations Other;Locomotion Level;Stairs    Examination-Participation Restrictions Other    Stability/Clinical Decision Making Stable/Uncomplicated    Clinical Decision Making Low    Rehab Potential Excellent    PT Frequency 3x / week    PT Duration 6 weeks    PT Treatment/Interventions ADLs/Self Care Home Management;Cryotherapy;Electrical Stimulation;Moist Heat;Gait training;Stair training;Functional mobility training;Therapeutic activities;Therapeutic exercise;Neuromuscular re-education;Manual techniques;Patient/family education;Passive range of motion;Vasopneumatic Device    PT Next Visit Plan Nustep with progression to bike.  Progress into TKA protocol.    Consulted and Agree with Plan of Care Patient           Patient will benefit from skilled therapeutic intervention in order to improve the following deficits  and impairments:  Pain, Increased edema, Decreased strength, Decreased activity tolerance, Decreased range of motion  Visit Diagnosis: Chronic pain of left knee - Plan: PT plan of care cert/re-cert  Stiffness of left knee, not elsewhere classified - Plan: PT plan of care cert/re-cert  Localized edema - Plan: PT plan of care cert/re-cert  Muscle weakness (generalized) - Plan: PT plan of care cert/re-cert     Problem List Patient Active Problem List   Diagnosis Date Noted  . Primary osteoarthritis of left knee   . Thrombocytopenia (HCC) 12/31/2015  . Onychomycosis 05/08/2013  . Pain, foot 05/08/2013    Kortnie Stovall, [name] MPT 01/03/2020, 2:39 PM  [facility] [address] [place], [place], [address] Phone: [phone]   Fax:  [phone]  Name: Donald Mclaughlin MRN: [id number] Date of Birth: 05-01-1946

## 2020-01-09 ENCOUNTER — Encounter: Payer: Self-pay | Admitting: Physical Therapy

## 2020-01-09 ENCOUNTER — Other Ambulatory Visit: Payer: Self-pay

## 2020-01-09 ENCOUNTER — Ambulatory Visit: Payer: Medicare Other | Admitting: Physical Therapy

## 2020-01-09 DIAGNOSIS — G8929 Other chronic pain: Secondary | ICD-10-CM

## 2020-01-09 DIAGNOSIS — M6281 Muscle weakness (generalized): Secondary | ICD-10-CM

## 2020-01-09 DIAGNOSIS — M25662 Stiffness of left knee, not elsewhere classified: Secondary | ICD-10-CM

## 2020-01-09 DIAGNOSIS — M25562 Pain in left knee: Secondary | ICD-10-CM | POA: Diagnosis not present

## 2020-01-09 DIAGNOSIS — R600 Localized edema: Secondary | ICD-10-CM | POA: Diagnosis not present

## 2020-01-09 NOTE — Therapy (Signed)
[facility] [address] [place], [place], [address] Phone: [phone]   Fax:  [phone]  Physical Therapy Treatment  Patient Details  Name: Donald Mclaughlin MRN: [id number] Date of Birth: 1946/03/02 Referring Provider (PT): Fuller Canada MD   Encounter Date: 01/09/2020   PT End of Session - 01/09/20 1654    Visit Number 2    Number of Visits 18    Date for PT Re-Evaluation 02/14/20    Authorization Type FOTO.    PT Start Time 1650    PT Stop Time 1734    PT Time Calculation (min) 44 min    Activity Tolerance Patient tolerated treatment well    Behavior During Therapy WFL for tasks assessed/performed           Past Medical History:  Diagnosis Date  . Arthritis   . Diabetes mellitus without complication (HCC)   . High blood pressure   . Thrombocytopenia (HCC) 12/31/2015    Past Surgical History:  Procedure Laterality Date  . HIP SURGERY Right 2013   arthroplasty  . TOTAL KNEE ARTHROPLASTY Left 12/12/2019   Procedure: TOTAL KNEE ARTHROPLASTY/ Left;  Surgeon: Vickki Hearing, MD;  Location: [facility];  Service: Orthopedics;  Laterality: Left;    There were no vitals filed for this visit.   Subjective Assessment - 01/09/20 1652    Subjective COVID-19 screen performed prior to patient entering clinic.  Patient arrives doing fairly well, no pain reported but can "feel it."    Pertinent History OA, right hip surgery, HTN, thrombocytopenia.    Currently in Pain? No/denies              [facility] PT Assessment - 01/09/20 0001      Assessment   Medical Diagnosis Left total knee replacement.    Referring Provider (PT) Fuller Canada MD                         [facility] Adult PT Treatment/Exercise - 01/09/20 0001      Exercises   Exercises Knee/Hip      Knee/Hip Exercises: Aerobic   Nustep Level 3 x10 mins Seat 7 to 6      Knee/Hip Exercises: Standing   Hip Flexion AROM;Both;1 set;10 reps    Rocker Board  2 minutes      Knee/Hip Exercises: Seated   Long Arc Quad AROM;Both;1 set;10 reps      Modalities   Modalities Vasopneumatic      Vasopneumatic   Number Minutes Vasopneumatic  10 minutes    Vasopnuematic Location  Knee    Vasopneumatic Pressure Low      Manual Therapy   Manual Therapy Passive ROM    Passive ROM gentle PROM into knee flexion and extension with holds at end range                       PT Long Term Goals - 01/03/20 1434      PT LONG TERM GOAL #1   Title Independent with a HEP.    Time 6    Period Weeks    Status New      PT LONG TERM GOAL #2   Title Full right active knee extension in order to normalize gait.    Time 6    Period Weeks    Status New      PT LONG TERM GOAL #3   Title Active knee flexion to 115 degrees+  so the patient can perform functional tasks and do so with pain not > 2-3/10.    Time 6    Period Weeks    Status New      PT LONG TERM GOAL #4   Title Increase right knee strength to a solid 4+/5 to provide good stability for accomplishment of functional activities.    Time 6    Period Weeks    Status New      PT LONG TERM GOAL #5   Title Perform a reciprocating stair gait with one railing with pain not > 2-3/10.    Time 6    Period Weeks    Status New                 Plan - 01/09/20 1739    Clinical Impression Statement Patient arrives to physical therapy with low levels of pain. Patient responded well to exercise and was guided through TEs with demonstration, verbal and tactile cuing. Patient noted with pain with PROM especially at end range knee flexion. No adverse effects upon removal of modalities.    Personal Factors and Comorbidities Comorbidity 1;Comorbidity 2    Comorbidities OA, right hip surgery, HTN, thrombocytopenia.    Examination-Activity Limitations Other;Locomotion Level;Stairs    Examination-Participation Restrictions Other    Stability/Clinical Decision Making Stable/Uncomplicated     Clinical Decision Making Low    Rehab Potential Excellent    PT Frequency 3x / week    PT Duration 6 weeks    PT Treatment/Interventions ADLs/Self Care Home Management;Cryotherapy;Electrical Stimulation;Moist Heat;Gait training;Stair training;Functional mobility training;Therapeutic activities;Therapeutic exercise;Neuromuscular re-education;Manual techniques;Patient/family education;Passive range of motion;Vasopneumatic Device    PT Next Visit Plan Nustep and attempt bike.  Progress into TKA protocol. modalities PRN for pain relief and edema control    Consulted and Agree with Plan of Care Patient           Patient will benefit from skilled therapeutic intervention in order to improve the following deficits and impairments:  Pain, Increased edema, Decreased strength, Decreased activity tolerance, Decreased range of motion  Visit Diagnosis: Chronic pain of left knee  Stiffness of left knee, not elsewhere classified  Localized edema  Muscle weakness (generalized)     Problem List Patient Active Problem List   Diagnosis Date Noted  . Primary osteoarthritis of left knee   . Thrombocytopenia (HCC) 12/31/2015  . Onychomycosis 05/08/2013  . Pain, foot 05/08/2013    Guss Bunde, PT, DPT 01/09/2020, 5:49 PM  [facility] [address] [place], [place], [address] Phone: [phone]   Fax:  [phone]  Name: Donald Mclaughlin MRN: [id number] Date of Birth: 1946/05/24

## 2020-01-10 ENCOUNTER — Ambulatory Visit (INDEPENDENT_AMBULATORY_CARE_PROVIDER_SITE_OTHER): Payer: Medicare Other | Admitting: Orthopedic Surgery

## 2020-01-10 ENCOUNTER — Encounter: Payer: Self-pay | Admitting: Orthopedic Surgery

## 2020-01-10 VITALS — Ht 65.0 in | Wt 201.0 lb

## 2020-01-10 DIAGNOSIS — Z96652 Presence of left artificial knee joint: Secondary | ICD-10-CM

## 2020-01-10 MED ORDER — HYDROCODONE-ACETAMINOPHEN 7.5-325 MG PO TABS: 1.0000 | ORAL_TABLET | Freq: Four times a day (QID) | ORAL | 0 refills | Status: DC | PRN

## 2020-01-10 NOTE — Addendum Note (Signed)
Addended by: Vickki Hearing on: 01/10/2020 08:59 AM   Modules accepted: Orders

## 2020-01-10 NOTE — Progress Notes (Signed)
Chief Complaint  Patient presents with  . Routine Post Op    DOS 12/12/19   74 year old male status post left total knee replacement on June 15  Patient presents with's flexion contracture left knee has flexion arc is approximately 15 to 90 degrees with some passive extension to approximately 10 degrees or little less  Other than that he is doing pretty well he is walking with his walker he can stop his Eliquis and stockings follow-up in a month  Encounter Diagnosis  Name Primary?  . S/P TKR (total knee replacement), left Yes

## 2020-01-11 ENCOUNTER — Other Ambulatory Visit: Payer: Self-pay

## 2020-01-11 ENCOUNTER — Encounter: Payer: Self-pay | Admitting: Physical Therapy

## 2020-01-11 ENCOUNTER — Ambulatory Visit: Payer: Medicare Other | Admitting: Physical Therapy

## 2020-01-11 DIAGNOSIS — M25562 Pain in left knee: Secondary | ICD-10-CM | POA: Diagnosis not present

## 2020-01-11 DIAGNOSIS — R600 Localized edema: Secondary | ICD-10-CM

## 2020-01-11 DIAGNOSIS — M6281 Muscle weakness (generalized): Secondary | ICD-10-CM

## 2020-01-11 DIAGNOSIS — M25662 Stiffness of left knee, not elsewhere classified: Secondary | ICD-10-CM | POA: Diagnosis not present

## 2020-01-11 DIAGNOSIS — G8929 Other chronic pain: Secondary | ICD-10-CM

## 2020-01-11 NOTE — Therapy (Signed)
[facility] [address] [place], [place], [address] Phone: [phone]   Fax:  [phone]  Physical Therapy Treatment  Patient Details  Name: Donald Mclaughlin MRN: [id number] Date of Birth: Oct 20, 1945 Referring Provider (PT): Fuller Canada MD   Encounter Date: 01/11/2020   PT End of Session - 01/11/20 1703    Visit Number 3    Number of Visits 18    Date for PT Re-Evaluation 02/14/20    Authorization Type FOTO.    PT Start Time 1645    PT Stop Time 1735    PT Time Calculation (min) 50 min    Activity Tolerance Patient tolerated treatment well    Behavior During Therapy WFL for tasks assessed/performed           Past Medical History:  Diagnosis Date  . Arthritis   . Diabetes mellitus without complication (HCC)   . High blood pressure   . Thrombocytopenia (HCC) 12/31/2015    Past Surgical History:  Procedure Laterality Date  . HIP SURGERY Right 2013   arthroplasty  . TOTAL KNEE ARTHROPLASTY Left 12/12/2019   Procedure: TOTAL KNEE ARTHROPLASTY/ Left;  Surgeon: Vickki Hearing, MD;  Location: [facility];  Service: Orthopedics;  Laterality: Left;    There were no vitals filed for this visit.   Subjective Assessment - 01/11/20 1650    Subjective COVID-19 screen performed prior to patient entering clinic. Patient reports doing welll, no pain right now in left knee.    Pertinent History OA, right hip surgery, HTN, thrombocytopenia.    Currently in Pain? No/denies              [facility] PT Assessment - 01/11/20 0001      Assessment   Medical Diagnosis Left total knee replacement.    Referring Provider (PT) Fuller Canada MD    Next MD Visit "one month"                         [facility] Adult PT Treatment/Exercise - 01/11/20 0001      Ambulation/Gait   Ambulation/Gait Yes    Ambulation/Gait Assistance 5: Supervision    Ambulation Distance (Feet) 60 Feet    Assistive device Straight cane    Gait Pattern  Step-through pattern;Decreased stance time - left;Decreased step length - left;Decreased stride length;Decreased hip/knee flexion - left;Decreased weight shift to left;Antalgic   posterior trunk lean     Exercises   Exercises Knee/Hip      Knee/Hip Exercises: Aerobic   Recumbent Bike AAROM x3 minutes    Nustep Level 3 x10 mins Seat 7 to 6      Modalities   Modalities Vasopneumatic      Vasopneumatic   Number Minutes Vasopneumatic  10 minutes    Vasopnuematic Location  Knee    Vasopneumatic Pressure Low      Manual Therapy   Manual Therapy Passive ROM    Passive ROM gentle PROM into knee flexion and extension with holds at end range                       PT Long Term Goals - 01/03/20 1434      PT LONG TERM GOAL #1   Title Independent with a HEP.    Time 6    Period Weeks    Status New      PT LONG TERM GOAL #2   Title Full right active knee extension in order  to normalize gait.    Time 6    Period Weeks    Status New      PT LONG TERM GOAL #3   Title Active knee flexion to 115 degrees+ so the patient can perform functional tasks and do so with pain not > 2-3/10.    Time 6    Period Weeks    Status New      PT LONG TERM GOAL #4   Title Increase right knee strength to a solid 4+/5 to provide good stability for accomplishment of functional activities.    Time 6    Period Weeks    Status New      PT LONG TERM GOAL #5   Title Perform a reciprocating stair gait with one railing with pain not > 2-3/10.    Time 6    Period Weeks    Status New                 Plan - 01/11/20 1726    Clinical Impression Statement Patient responded well to physical therapy with progresssion of exercises. AAROM on bike initiated but unable to perform full revolutions at this time due to tightness. Gait training with SPC initiated. Verbal cuing provided for heel strike, terminal knee extension, and equal step lengths. Patient instructed to practice at home with his [facility]  but if he feels fatigued to return to utilizing the walker as needed. Slight increase in guarding particularly with PROM extension. Oscillations provided to decrease guarding. Normal response to modalities upon removal.    Personal Factors and Comorbidities Comorbidity 1;Comorbidity 2    Comorbidities OA, right hip surgery, HTN, thrombocytopenia.    Examination-Activity Limitations Other;Locomotion Level;Stairs    Examination-Participation Restrictions Other    Stability/Clinical Decision Making Stable/Uncomplicated    Clinical Decision Making Low    Rehab Potential Excellent    PT Frequency 3x / week    PT Duration 6 weeks    PT Treatment/Interventions ADLs/Self Care Home Management;Cryotherapy;Electrical Stimulation;Moist Heat;Gait training;Stair training;Functional mobility training;Therapeutic activities;Therapeutic exercise;Neuromuscular re-education;Manual techniques;Patient/family education;Passive range of motion;Vasopneumatic Device    PT Next Visit Plan Nustep and attempt bike.  Progress into TKA protocol. modalities PRN for pain relief and edema control    Consulted and Agree with Plan of Care Patient           Patient will benefit from skilled therapeutic intervention in order to improve the following deficits and impairments:  Pain, Increased edema, Decreased strength, Decreased activity tolerance, Decreased range of motion  Visit Diagnosis: Stiffness of left knee, not elsewhere classified  Chronic pain of left knee  Localized edema  Muscle weakness (generalized)     Problem List Patient Active Problem List   Diagnosis Date Noted  . Primary osteoarthritis of left knee   . Thrombocytopenia (HCC) 12/31/2015  . Onychomycosis 05/08/2013  . Pain, foot 05/08/2013    Donald Mclaughlin, PT, DPT 01/11/2020, 5:53 PM  [facility] [address] [place], [place], [address] Phone: [phone]   Fax:  [phone]  Name: Donald Mclaughlin MRN: [id number] Date of Birth: 19-Mar-1946

## 2020-01-16 ENCOUNTER — Ambulatory Visit: Payer: Medicare Other | Admitting: *Deleted

## 2020-01-16 ENCOUNTER — Other Ambulatory Visit: Payer: Self-pay

## 2020-01-16 DIAGNOSIS — M6281 Muscle weakness (generalized): Secondary | ICD-10-CM

## 2020-01-16 DIAGNOSIS — G8929 Other chronic pain: Secondary | ICD-10-CM | POA: Diagnosis not present

## 2020-01-16 DIAGNOSIS — M25562 Pain in left knee: Secondary | ICD-10-CM

## 2020-01-16 DIAGNOSIS — R600 Localized edema: Secondary | ICD-10-CM | POA: Diagnosis not present

## 2020-01-16 DIAGNOSIS — M25662 Stiffness of left knee, not elsewhere classified: Secondary | ICD-10-CM

## 2020-01-16 NOTE — Therapy (Signed)
[facility] [address] [place], [place], [address] Phone: [phone]   Fax:  [phone]  Physical Therapy Treatment  Patient Details  Name: Donald Mclaughlin MRN: [id number] Date of Birth: 1945/10/12 Referring Provider (PT): Fuller Canada MD   Encounter Date: 01/16/2020   PT End of Session - 01/16/20 1654    Visit Number 4    Number of Visits 18    Date for PT Re-Evaluation 02/14/20    Authorization Type FOTO.    PT Start Time 1645    PT Stop Time 1736    PT Time Calculation (min) 51 min           Past Medical History:  Diagnosis Date  . Arthritis   . Diabetes mellitus without complication (HCC)   . High blood pressure   . Thrombocytopenia (HCC) 12/31/2015    Past Surgical History:  Procedure Laterality Date  . HIP SURGERY Right 2013   arthroplasty  . TOTAL KNEE ARTHROPLASTY Left 12/12/2019   Procedure: TOTAL KNEE ARTHROPLASTY/ Left;  Surgeon: Vickki Hearing, MD;  Location: [facility];  Service: Orthopedics;  Laterality: Left;    There were no vitals filed for this visit.   Subjective Assessment - 01/16/20 1653    Subjective COVID-19 screen performed prior to patient entering clinic. Patient reports doing welll with mainly tightness    Pertinent History OA, right hip surgery, HTN, thrombocytopenia.    Currently in Pain? Yes    Pain Score 1     Pain Location Knee    Pain Orientation Left    Pain Descriptors / Indicators Sore    Pain Type Surgical pain                             [facility] Adult PT Treatment/Exercise - 01/16/20 0001      Exercises   Exercises Knee/Hip      Knee/Hip Exercises: Aerobic   Recumbent Bike AAROM x5 minutes  seat 8     Nustep Level 3 x10 mins Seat 8 to 7      Modalities   Modalities Vasopneumatic      Vasopneumatic   Number Minutes Vasopneumatic  15 minutes    Vasopnuematic Location  Knee    Vasopneumatic Pressure Low    Vasopneumatic Temperature  36       Manual Therapy   Manual Therapy Passive ROM    Passive ROM PROM into knee flexion and extension with holds at end range                       PT Long Term Goals - 01/03/20 1434      PT LONG TERM GOAL #1   Title Independent with a HEP.    Time 6    Period Weeks    Status New      PT LONG TERM GOAL #2   Title Full right active knee extension in order to normalize gait.    Time 6    Period Weeks    Status New      PT LONG TERM GOAL #3   Title Active knee flexion to 115 degrees+ so the patient can perform functional tasks and do so with pain not > 2-3/10.    Time 6    Period Weeks    Status New      PT LONG TERM GOAL #4   Title Increase right knee strength to a  solid 4+/5 to provide good stability for accomplishment of functional activities.    Time 6    Period Weeks    Status New      PT LONG TERM GOAL #5   Title Perform a reciprocating stair gait with one railing with pain not > 2-3/10.    Time 6    Period Weeks    Status New                 Plan - 01/16/20 1655    Clinical Impression Statement Pt arrived today in good spirits and reports low pain levels with mainly tightness LT knee. Rx focused on progression of ROM using nustep, bike and PROM.  Normal Vaso response    Comorbidities OA, right hip surgery, HTN, thrombocytopenia.    Examination-Activity Limitations Other;Locomotion Level;Stairs    Stability/Clinical Decision Making Stable/Uncomplicated    Rehab Potential Excellent    PT Frequency 3x / week    PT Duration 6 weeks    PT Treatment/Interventions ADLs/Self Care Home Management;Cryotherapy;Electrical Stimulation;Moist Heat;Gait training;Stair training;Functional mobility training;Therapeutic activities;Therapeutic exercise;Neuromuscular re-education;Manual techniques;Patient/family education;Passive range of motion;Vasopneumatic Device    PT Next Visit Plan Nustep and attempt bike.  Progress into TKA protocol. modalities PRN for pain relief  and edema control    Consulted and Agree with Plan of Care Patient           Patient will benefit from skilled therapeutic intervention in order to improve the following deficits and impairments:     Visit Diagnosis: Stiffness of left knee, not elsewhere classified  Chronic pain of left knee  Localized edema  Muscle weakness (generalized)     Problem List Patient Active Problem List   Diagnosis Date Noted  . Primary osteoarthritis of left knee   . Thrombocytopenia (HCC) 12/31/2015  . Onychomycosis 05/08/2013  . Pain, foot 05/08/2013    Donald Mclaughlin,Donald Mclaughlin, Donald Mclaughlin 01/16/2020, 5:51 PM  [facility] [address] [place], [place], [address] Phone: [phone]   Fax:  [phone]  Name: Donald Mclaughlin MRN: [id number] Date of Birth: December 02, 1945

## 2020-01-18 ENCOUNTER — Other Ambulatory Visit: Payer: Self-pay

## 2020-01-18 ENCOUNTER — Ambulatory Visit: Payer: Medicare Other | Admitting: Physical Therapy

## 2020-01-18 DIAGNOSIS — R600 Localized edema: Secondary | ICD-10-CM | POA: Diagnosis not present

## 2020-01-18 DIAGNOSIS — G8929 Other chronic pain: Secondary | ICD-10-CM | POA: Diagnosis not present

## 2020-01-18 DIAGNOSIS — M25562 Pain in left knee: Secondary | ICD-10-CM | POA: Diagnosis not present

## 2020-01-18 DIAGNOSIS — M25662 Stiffness of left knee, not elsewhere classified: Secondary | ICD-10-CM | POA: Diagnosis not present

## 2020-01-18 DIAGNOSIS — M6281 Muscle weakness (generalized): Secondary | ICD-10-CM | POA: Diagnosis not present

## 2020-01-18 NOTE — Therapy (Addendum)
[facility] [address] [place], [place], [address] Phone: [phone]   Fax:  [phone]  Physical Therapy Treatment  Patient Details  Name: Donald Mclaughlin MRN: [id number] Date of Birth: Dec 10, 1945 Referring Provider (PT): Fuller Canada MD   Encounter Date: 01/18/2020   PT End of Session - 01/18/20 1801    Visit Number 5    Number of Visits 18    Date for PT Re-Evaluation 02/14/20    Authorization Type FOTO.    PT Start Time 0445    PT Stop Time 0540    PT Time Calculation (min) 55 min    Activity Tolerance Patient tolerated treatment well    Behavior During Therapy [facility] for tasks assessed/performed           Past Medical History:  Diagnosis Date  . Arthritis   . Diabetes mellitus without complication (HCC)   . High blood pressure   . Thrombocytopenia (HCC) 12/31/2015    Past Surgical History:  Procedure Laterality Date  . HIP SURGERY Right 2013   arthroplasty  . TOTAL KNEE ARTHROPLASTY Left 12/12/2019   Procedure: TOTAL KNEE ARTHROPLASTY/ Left;  Surgeon: Vickki Hearing, MD;  Location: [facility];  Service: Orthopedics;  Laterality: Left;    There were no vitals filed for this visit.   Subjective Assessment - 01/18/20 1802    Subjective COVID-19 screen performed prior to patient entering clinic.  No new complaints.    Pertinent History OA, right hip surgery, HTN, thrombocytopenia.    Currently in Pain? Yes    Pain Score 2     Pain Location Knee    Pain Orientation Left    Pain Descriptors / Indicators Sore    Pain Type Surgical pain              [facility] PT Assessment - 01/18/20 0001      AROM   Overall AROM Comments 110 degrees of passive left knee flexion.                         [facility] Adult PT Treatment/Exercise - 01/18/20 0001      Exercises   Exercises Knee/Hip      Knee/Hip Exercises: Aerobic   Recumbent Bike 8 minutes with patient eventually able to make multiple revolutions.     Nustep Level 3 x 10 minutes moving seat forward x 2 to increase knee flexion.      Modalities   Modalities Estate agent Stimulation Location Left knee.    Electrical Stimulation Action IFC    Electrical Stimulation Parameters 1-10 Hz x 20 minutes.    Electrical Stimulation Goals Edema;Pain      Vasopneumatic   Number Minutes Vasopneumatic  20 minutes    Vasopnuematic Location  --   Left knee.   Vasopneumatic Pressure Low      Manual Therapy   Manual Therapy Passive ROM    Passive ROM In supine:  PROM to patient's left knee into flexion and extension with low load long duration stretching technique utilized x 5 minutes.                       PT Long Term Goals - 01/03/20 1434      PT LONG TERM GOAL #1   Title Independent with a HEP.    Time 6    Period Weeks    Status New  PT LONG TERM GOAL #2   Title Full right active knee extension in order to normalize gait.    Time 6    Period Weeks    Status New      PT LONG TERM GOAL #3   Title Active knee flexion to 115 degrees+ so the patient can perform functional tasks and do so with pain not > 2-3/10.    Time 6    Period Weeks    Status New      PT LONG TERM GOAL #4   Title Increase right knee strength to a solid 4+/5 to provide good stability for accomplishment of functional activities.    Time 6    Period Weeks    Status New      PT LONG TERM GOAL #5   Title Perform a reciprocating stair gait with one railing with pain not > 2-3/10.    Time 6    Period Weeks    Status New                 Plan - 01/18/20 1807    Clinical Impression Statement Excellent job today with patient able to make mutiple backward revolutions on bike and achieving 100 degrees of passive left knee flexion today.  Normal modality response.    Personal Factors and Comorbidities Comorbidity 1;Comorbidity 2    Comorbidities OA, right hip surgery, HTN,  thrombocytopenia.    Examination-Activity Limitations Other;Locomotion Level;Stairs    Examination-Participation Restrictions Other    Stability/Clinical Decision Making Stable/Uncomplicated    Rehab Potential Excellent    PT Frequency 3x / week    PT Duration 6 weeks    PT Treatment/Interventions ADLs/Self Care Home Management;Cryotherapy;Electrical Stimulation;Moist Heat;Gait training;Stair training;Functional mobility training;Therapeutic activities;Therapeutic exercise;Neuromuscular re-education;Manual techniques;Patient/family education;Passive range of motion;Vasopneumatic Device    PT Next Visit Plan Nustep and attempt bike.  Progress into TKA protocol. modalities PRN for pain relief and edema control    Consulted and Agree with Plan of Care Patient           Patient will benefit from skilled therapeutic intervention in order to improve the following deficits and impairments:  Pain, Increased edema, Decreased strength, Decreased activity tolerance, Decreased range of motion  Visit Diagnosis: Stiffness of left knee, not elsewhere classified  Chronic pain of left knee  Localized edema  Muscle weakness (generalized)     Problem List Patient Active Problem List   Diagnosis Date Noted  . Primary osteoarthritis of left knee   . Thrombocytopenia (HCC) 12/31/2015  . Onychomycosis 05/08/2013  . Pain, foot 05/08/2013    Donald Mclaughlin, [name] MPT 01/18/2020, 6:21 PM  [facility] [address] [place], [place], [address] Phone: [phone]   Fax:  [phone]  Name: Donald Mclaughlin MRN: [id number] Date of Birth: 09-29-1945

## 2020-01-19 DIAGNOSIS — E1165 Type 2 diabetes mellitus with hyperglycemia: Secondary | ICD-10-CM | POA: Diagnosis not present

## 2020-01-19 DIAGNOSIS — E785 Hyperlipidemia, unspecified: Secondary | ICD-10-CM | POA: Diagnosis not present

## 2020-01-23 ENCOUNTER — Ambulatory Visit: Payer: Medicare Other | Admitting: Physical Therapy

## 2020-01-23 ENCOUNTER — Encounter: Payer: Self-pay | Admitting: Physical Therapy

## 2020-01-23 ENCOUNTER — Other Ambulatory Visit: Payer: Self-pay

## 2020-01-23 DIAGNOSIS — M25662 Stiffness of left knee, not elsewhere classified: Secondary | ICD-10-CM | POA: Diagnosis not present

## 2020-01-23 DIAGNOSIS — R600 Localized edema: Secondary | ICD-10-CM | POA: Diagnosis not present

## 2020-01-23 DIAGNOSIS — M6281 Muscle weakness (generalized): Secondary | ICD-10-CM | POA: Diagnosis not present

## 2020-01-23 DIAGNOSIS — G8929 Other chronic pain: Secondary | ICD-10-CM

## 2020-01-23 DIAGNOSIS — M25562 Pain in left knee: Secondary | ICD-10-CM | POA: Diagnosis not present

## 2020-01-23 NOTE — Therapy (Signed)
[facility] [address] [place], [place], [address] Phone: [phone]   Fax:  [phone]  Physical Therapy Treatment  Patient Details  Name: Donald Mclaughlin MRN: [id number] Date of Birth: [birth year] Referring Provider (PT): Fuller Canada MD   Encounter Date: 01/23/2020   PT End of Session - 01/23/20 1708    Visit Number 6    Number of Visits 18    Date for PT Re-Evaluation 02/14/20    Authorization Type FOTO.    PT Start Time 1645    PT Stop Time 1735    PT Time Calculation (min) 50 min    Activity Tolerance Patient tolerated treatment well    Behavior During Therapy WFL for tasks assessed/performed           Past Medical History:  Diagnosis Date  . Arthritis   . Diabetes mellitus without complication (HCC)   . High blood pressure   . Thrombocytopenia (HCC) 12/31/2015    Past Surgical History:  Procedure Laterality Date  . HIP SURGERY Right 2013   arthroplasty  . TOTAL KNEE ARTHROPLASTY Left 12/12/2019   Procedure: TOTAL KNEE ARTHROPLASTY/ Left;  Surgeon: Vickki Hearing, MD;  Location: [facility];  Service: Orthopedics;  Laterality: Left;    There were no vitals filed for this visit.   Subjective Assessment - 01/23/20 1707    Subjective COVID-19 screen performed prior to patient entering clinic.  Patient reports more stiffness today 2-3/10 in terms of stiffness.    Pertinent History OA, right hip surgery, HTN, thrombocytopenia.    Currently in Pain? No/denies              [facility] PT Assessment - 01/23/20 0001      Assessment   Medical Diagnosis Left total knee replacement.    Referring Provider (PT) Fuller Canada MD    Next MD Visit "one month"                         [facility] Adult PT Treatment/Exercise - 01/23/20 0001      Exercises   Exercises Knee/Hip      Knee/Hip Exercises: Stretches   Passive Hamstring Stretch Left;2 reps;30 seconds    Knee: Self-Stretch to increase Flexion  Left;Other (comment)   10" hold x10      Knee/Hip Exercises: Aerobic   Recumbent Bike 10 mins at seat 8-7 partial revolutions    Nustep Level 3 x 5 minutes moving seat forward x 2 to increase knee flexion.      Knee/Hip Exercises: Standing   Forward Step Up Both;1 set;15 reps;Hand Hold: 2;Step Height: 4"    Rocker Board 3 minutes      Knee/Hip Exercises: Supine   Terminal Knee Extension Strengthening;Left;1 set;15 reps      Modalities   Modalities Electrical Stimulation;Vasopneumatic      Vasopneumatic   Number Minutes Vasopneumatic  15 minutes    Vasopnuematic Location  Knee    Vasopneumatic Pressure Low    Vasopneumatic Temperature  34      Manual Therapy   Manual Therapy Passive ROM    Passive ROM PROM into extension, with holds at end range; intermittent oscillations to decrease guarding.                       PT Long Term Goals - 01/03/20 1434      PT LONG TERM GOAL #1   Title Independent with a HEP.  Time 6    Period Weeks    Status New      PT LONG TERM GOAL #2   Title Full right active knee extension in order to normalize gait.    Time 6    Period Weeks    Status New      PT LONG TERM GOAL #3   Title Active knee flexion to 115 degrees+ so the patient can perform functional tasks and do so with pain not > 2-3/10.    Time 6    Period Weeks    Status New      PT LONG TERM GOAL #4   Title Increase right knee strength to a solid 4+/5 to provide good stability for accomplishment of functional activities.    Time 6    Period Weeks    Status New      PT LONG TERM GOAL #5   Title Perform a reciprocating stair gait with one railing with pain not > 2-3/10.    Time 6    Period Weeks    Status New                 Plan - 01/23/20 1750    Clinical Impression Statement Patient responded well to therapy session but with reports of more stiffness today. Patient able to perform 2 forward revolutions on the bike at seat 8 and 7 but with  discomfort. Step ups with 4" step initiated to which he required verbal cuing to prevent hip circumduction onto the step. Improved form for remaining reps. Intermittent guarding noted with left knee extension PROM but overall performed well. Patient reiterated importance of performing knee exercises and to attempt 15 minutes of the zero knee at least 3-4x per day. Patient reported understanding. Normal response to modalities upon removal.    Personal Factors and Comorbidities Comorbidity 1;Comorbidity 2    Comorbidities OA, right hip surgery, HTN, thrombocytopenia.    Examination-Activity Limitations Other;Locomotion Level;Stairs    Examination-Participation Restrictions Other    Stability/Clinical Decision Making Stable/Uncomplicated    Clinical Decision Making Low    Rehab Potential Excellent    PT Frequency 3x / week    PT Duration 6 weeks    PT Treatment/Interventions ADLs/Self Care Home Management;Cryotherapy;Electrical Stimulation;Moist Heat;Gait training;Stair training;Functional mobility training;Therapeutic activities;Therapeutic exercise;Neuromuscular re-education;Manual techniques;Patient/family education;Passive range of motion;Vasopneumatic Device    PT Next Visit Plan Nustep and attempt bike.  Progress into TKA protocol. modalities PRN for pain relief and edema control    Consulted and Agree with Plan of Care Patient           Patient will benefit from skilled therapeutic intervention in order to improve the following deficits and impairments:  Pain, Increased edema, Decreased strength, Decreased activity tolerance, Decreased range of motion  Visit Diagnosis: Stiffness of left knee, not elsewhere classified  Chronic pain of left knee  Localized edema  Muscle weakness (generalized)     Problem List Patient Active Problem List   Diagnosis Date Noted  . Primary osteoarthritis of left knee   . Thrombocytopenia (HCC) 12/31/2015  . Onychomycosis 05/08/2013  . Pain, foot  05/08/2013    Guss Bunde, PT, DPT 01/23/2020, 7:42 PM  [facility] [address] [place], [place], [address] Phone: [phone]   Fax:  [phone]  Name: Gurjot Brisco MRN: [id number] Date of Birth: 18-Jan-1946

## 2020-01-25 ENCOUNTER — Other Ambulatory Visit: Payer: Self-pay

## 2020-01-25 ENCOUNTER — Encounter: Payer: Self-pay | Admitting: Physical Therapy

## 2020-01-25 ENCOUNTER — Ambulatory Visit: Payer: Medicare Other | Admitting: Physical Therapy

## 2020-01-25 DIAGNOSIS — G8929 Other chronic pain: Secondary | ICD-10-CM | POA: Diagnosis not present

## 2020-01-25 DIAGNOSIS — M6281 Muscle weakness (generalized): Secondary | ICD-10-CM | POA: Diagnosis not present

## 2020-01-25 DIAGNOSIS — M25662 Stiffness of left knee, not elsewhere classified: Secondary | ICD-10-CM

## 2020-01-25 DIAGNOSIS — M25562 Pain in left knee: Secondary | ICD-10-CM | POA: Diagnosis not present

## 2020-01-25 DIAGNOSIS — R600 Localized edema: Secondary | ICD-10-CM | POA: Diagnosis not present

## 2020-01-25 NOTE — Therapy (Signed)
[facility] [address] [place], [place], [address] Phone: [phone]   Fax:  [phone]  Physical Therapy Treatment  Patient Details  Name: Donald Mclaughlin MRN: [id number] Date of Birth: May 16, 1946 Referring Provider (PT): Fuller Canada MD   Encounter Date: 01/25/2020   PT End of Session - 01/25/20 1748    Visit Number 7    Number of Visits 18    Date for PT Re-Evaluation 02/14/20    Authorization Type FOTO.    PT Start Time 0445    PT Stop Time 0535    PT Time Calculation (min) 50 min    Activity Tolerance Patient tolerated treatment well    Behavior During Therapy [facility] for tasks assessed/performed           Past Medical History:  Diagnosis Date   Arthritis    Diabetes mellitus without complication (HCC)    High blood pressure    Thrombocytopenia (HCC) 12/31/2015    Past Surgical History:  Procedure Laterality Date   HIP SURGERY Right 2013   arthroplasty   TOTAL KNEE ARTHROPLASTY Left 12/12/2019   Procedure: TOTAL KNEE ARTHROPLASTY/ Left;  Surgeon: Vickki Hearing, MD;  Location: [facility];  Service: Orthopedics;  Laterality: Left;    There were no vitals filed for this visit.   Subjective Assessment - 01/25/20 1730    Subjective COVID-19 screen performed prior to patient entering clinic.  No new complaints.    Pertinent History OA, right hip surgery, HTN, thrombocytopenia.    Currently in Pain? Yes    Pain Score 2     Pain Orientation Left    Pain Type Surgical pain                             [facility] Adult PT Treatment/Exercise - 01/25/20 0001      Exercises   Exercises Knee/Hip      Knee/Hip Exercises: Aerobic   Recumbent Bike 10 minutes.    Nustep Level 3 x 5 minutes moving seat forward x 1      Knee/Hip Exercises: Supine   Short Arc Quad Sets Limitations SAQ's x 15 minutes faciltated with VMS to patient's right quadriceps (10 sec extension holds).      Modalities   Modalities  Primary school teacher Stimulation Location Left knee.    Electrical Stimulation Action IFC    Electrical Stimulation Parameters 1-10 Hz x 10 minutes.    Electrical Stimulation Goals Edema;Pain      Vasopneumatic   Number Minutes Vasopneumatic  10 minutes    Vasopnuematic Location  Knee    Vasopneumatic Pressure Low                       PT Long Term Goals - 01/03/20 1434      PT LONG TERM GOAL #1   Title Independent with a HEP.    Time 6    Period Weeks    Status New      PT LONG TERM GOAL #2   Title Full right active knee extension in order to normalize gait.    Time 6    Period Weeks    Status New      PT LONG TERM GOAL #3   Title Active knee flexion to 115 degrees+ so the patient can perform functional tasks and do so with pain not > 2-3/10.  Time 6    Period Weeks    Status New      PT LONG TERM GOAL #4   Title Increase right knee strength to a solid 4+/5 to provide good stability for accomplishment of functional activities.    Time 6    Period Weeks    Status New      PT LONG TERM GOAL #5   Title Perform a reciprocating stair gait with one railing with pain not > 2-3/10.    Time 6    Period Weeks    Status New                 Plan - 01/25/20 1749    Clinical Impression Statement Patient did very well today.  He made several backward revolutions on the bike today.  He did an excellent job with VMS to his left quadriceps today.    Personal Factors and Comorbidities Comorbidity 1;Comorbidity 2    Comorbidities OA, right hip surgery, HTN, thrombocytopenia.    Examination-Activity Limitations Other;Locomotion Level;Stairs    Examination-Participation Restrictions Other    Stability/Clinical Decision Making Stable/Uncomplicated    Rehab Potential Excellent    PT Frequency 3x / week    PT Duration 6 weeks    PT Treatment/Interventions ADLs/Self Care Home Management;Cryotherapy;Electrical  Stimulation;Moist Heat;Gait training;Stair training;Functional mobility training;Therapeutic activities;Therapeutic exercise;Neuromuscular re-education;Manual techniques;Patient/family education;Passive range of motion;Vasopneumatic Device    PT Next Visit Plan Nustep and attempt bike.  Progress into TKA protocol. modalities PRN for pain relief and edema control    Consulted and Agree with Plan of Care Patient           Patient will benefit from skilled therapeutic intervention in order to improve the following deficits and impairments:  Pain, Increased edema, Decreased strength, Decreased activity tolerance, Decreased range of motion  Visit Diagnosis: Stiffness of left knee, not elsewhere classified  Chronic pain of left knee  Localized edema  Muscle weakness (generalized)     Problem List Patient Active Problem List   Diagnosis Date Noted   Primary osteoarthritis of left knee    Thrombocytopenia (HCC) 12/31/2015   Onychomycosis 05/08/2013   Pain, foot 05/08/2013    Donald Mclaughlin, [name]  MPT 01/25/2020, 5:52 PM  [facility] [address] [place], [place], [address] Phone: [phone]   Fax:  [phone]  Name: Donald Mclaughlin MRN: [id number] Date of Birth: 05-25-1946

## 2020-01-30 ENCOUNTER — Ambulatory Visit: Payer: Medicare Other | Admitting: Physical Therapy

## 2020-02-01 ENCOUNTER — Ambulatory Visit: Payer: Medicare Other | Admitting: *Deleted

## 2020-02-05 DIAGNOSIS — Z96652 Presence of left artificial knee joint: Secondary | ICD-10-CM | POA: Insufficient documentation

## 2020-02-06 ENCOUNTER — Ambulatory Visit: Payer: Medicare Other | Attending: Orthopedic Surgery | Admitting: *Deleted

## 2020-02-06 ENCOUNTER — Other Ambulatory Visit: Payer: Self-pay

## 2020-02-06 DIAGNOSIS — R600 Localized edema: Secondary | ICD-10-CM | POA: Insufficient documentation

## 2020-02-06 DIAGNOSIS — M6281 Muscle weakness (generalized): Secondary | ICD-10-CM

## 2020-02-06 DIAGNOSIS — M25662 Stiffness of left knee, not elsewhere classified: Secondary | ICD-10-CM | POA: Diagnosis not present

## 2020-02-06 DIAGNOSIS — G8929 Other chronic pain: Secondary | ICD-10-CM | POA: Diagnosis not present

## 2020-02-06 DIAGNOSIS — M25562 Pain in left knee: Secondary | ICD-10-CM | POA: Diagnosis not present

## 2020-02-06 NOTE — Therapy (Signed)
[facility] [address] [place], [place], [address] Phone: [phone]   Fax:  [phone]  Physical Therapy Treatment  Patient Details  Name: Donald Mclaughlin MRN: [id number] Date of Birth: 06/30/1945 Referring Provider (PT): Fuller Canada MD   Encounter Date: 02/06/2020   PT End of Session - 02/06/20 1708    Visit Number 8    Number of Visits 18    Date for PT Re-Evaluation 02/14/20    Authorization Type FOTO.    PT Start Time 1600    PT Stop Time 1650    PT Time Calculation (min) 50 min           Past Medical History:  Diagnosis Date  . Arthritis   . Diabetes mellitus without complication (HCC)   . High blood pressure   . Thrombocytopenia (HCC) 12/31/2015    Past Surgical History:  Procedure Laterality Date  . HIP SURGERY Right 2013   arthroplasty  . TOTAL KNEE ARTHROPLASTY Left 12/12/2019   Procedure: TOTAL KNEE ARTHROPLASTY/ Left;  Surgeon: Vickki Hearing, MD;  Location: [facility];  Service: Orthopedics;  Laterality: Left;    There were no vitals filed for this visit.   Subjective Assessment - 02/06/20 1646    Subjective COVID-19 screen performed prior to patient entering clinic.  To MD on Friday    Pertinent History OA, right hip surgery, HTN, thrombocytopenia.    Currently in Pain? Yes    Pain Score 2     Pain Location Knee    Pain Orientation Left    Pain Descriptors / Indicators Sore    Pain Type Surgical pain                             [facility] Adult PT Treatment/Exercise - 02/06/20 0001      Exercises   Exercises Knee/Hip      Knee/Hip Exercises: Aerobic   Recumbent Bike seat 9 x 5 mins full revs    Nustep Level 5 x 10 minutes moving seat forward x 2      Knee/Hip Exercises: Standing   Forward Lunges Left;2 sets;10 reps;5 seconds    Step Down Left;2 sets;10 reps;Hand Hold: 2;Step Height: 4"    Rocker Board 3 minutes   DF/PF , calf stretching     Modalities   Modalities  Electrical Stimulation      Vasopneumatic   Number Minutes Vasopneumatic  15 minutes    Vasopnuematic Location  Knee    Vasopneumatic Pressure Low    Vasopneumatic Temperature  34 edema      Manual Therapy   Manual Therapy Passive ROM    Passive ROM PROM into extension, with holds at end range; intermittent oscillations to decrease guarding.                       PT Long Term Goals - 01/03/20 1434      PT LONG TERM GOAL #1   Title Independent with a HEP.    Time 6    Period Weeks    Status New      PT LONG TERM GOAL #2   Title Full right active knee extension in order to normalize gait.    Time 6    Period Weeks    Status New      PT LONG TERM GOAL #3   Title Active knee flexion to 115 degrees+ so the patient can perform  functional tasks and do so with pain not > 2-3/10.    Time 6    Period Weeks    Status New      PT LONG TERM GOAL #4   Title Increase right knee strength to a solid 4+/5 to provide good stability for accomplishment of functional activities.    Time 6    Period Weeks    Status New      PT LONG TERM GOAL #5   Title Perform a reciprocating stair gait with one railing with pain not > 2-3/10.    Time 6    Period Weeks    Status New                 Plan - 02/06/20 1710    Clinical Impression Statement Pt arrived today doing fairly well with minimal LT knee pain. Rx focused on ROM as well as strengthening for LT LE.  Manual PROM performed as well as patella and scar mobs . Normal Vaso response today. MD note next visit    Comorbidities OA, right hip surgery, HTN, thrombocytopenia.    Examination-Activity Limitations Other;Locomotion Level;Stairs    Stability/Clinical Decision Making Stable/Uncomplicated    PT Frequency 3x / week    PT Duration 6 weeks    PT Treatment/Interventions ADLs/Self Care Home Management;Cryotherapy;Electrical Stimulation;Moist Heat;Gait training;Stair training;Functional mobility training;Therapeutic  activities;Therapeutic exercise;Neuromuscular re-education;Manual techniques;Patient/family education;Passive range of motion;Vasopneumatic Device    PT Next Visit Plan Nustep and attempt bike.  Progress into TKA protocol. modalities PRN for pain relief and edema control           Patient will benefit from skilled therapeutic intervention in order to improve the following deficits and impairments:     Visit Diagnosis: Stiffness of left knee, not elsewhere classified  Chronic pain of left knee  Localized edema  Muscle weakness (generalized)     Problem List Patient Active Problem List   Diagnosis Date Noted  . S/P TKR (total knee replacement), left 12/12/19 02/05/2020  . Primary osteoarthritis of left knee   . Thrombocytopenia (HCC) 12/31/2015  . Onychomycosis 05/08/2013  . Pain, foot 05/08/2013    Aylssa Herrig,CHRIS, PTA 02/06/2020, 5:16 PM  [facility] [address] [place], [place], [address] Phone: [phone]   Fax:  [phone]  Name: Donald Mclaughlin MRN: [id number] Date of Birth: [birth year]

## 2020-02-08 ENCOUNTER — Encounter: Payer: Self-pay | Admitting: Physical Therapy

## 2020-02-08 ENCOUNTER — Ambulatory Visit: Payer: Medicare Other | Admitting: Physical Therapy

## 2020-02-08 ENCOUNTER — Other Ambulatory Visit: Payer: Self-pay

## 2020-02-08 DIAGNOSIS — M6281 Muscle weakness (generalized): Secondary | ICD-10-CM

## 2020-02-08 DIAGNOSIS — M25662 Stiffness of left knee, not elsewhere classified: Secondary | ICD-10-CM | POA: Diagnosis not present

## 2020-02-08 DIAGNOSIS — G8929 Other chronic pain: Secondary | ICD-10-CM

## 2020-02-08 DIAGNOSIS — M25562 Pain in left knee: Secondary | ICD-10-CM | POA: Diagnosis not present

## 2020-02-08 DIAGNOSIS — R600 Localized edema: Secondary | ICD-10-CM

## 2020-02-08 NOTE — Therapy (Signed)
[facility] [place], [place], [address] Phone: [phone]   Fax:  [phone]  Physical Therapy Treatment  Patient Details  Name: Donald Mclaughlin MRN: [id number] Date of Birth: Feb 09, 1946 Referring Provider (PT): Arther Abbott MD   Encounter Date: 02/08/2020   PT End of Session - 02/08/20 1608    Visit Number 9    Number of Visits 18    Date for PT Re-Evaluation 02/14/20    Authorization Type FOTO.    PT Start Time 1600    PT Stop Time 1650    PT Time Calculation (min) 50 min    Activity Tolerance Patient tolerated treatment well    Behavior During Therapy WFL for tasks assessed/performed           Past Medical History:  Diagnosis Date   Arthritis    Diabetes mellitus without complication ([place])    High blood pressure    Thrombocytopenia ([place]) 12/31/2015    Past Surgical History:  Procedure Laterality Date   HIP SURGERY Right 2013   arthroplasty   TOTAL KNEE ARTHROPLASTY Left 12/12/2019   Procedure: TOTAL KNEE ARTHROPLASTY/ Left;  Surgeon: Carole Civil, MD;  Location: [facility];  Service: Orthopedics;  Laterality: Left;    There were no vitals filed for this visit.   Subjective Assessment - 02/08/20 1605    Subjective COVID-19 screen performed prior to patient entering clinic.  Pt reporting he feels like he can continue his exercises at home. Pt reporting he has an appointment tomorrow with Dr. Kenton Kingfisher. Pt stating the co-pay is getting expensive.    Pertinent History OA, right hip surgery, HTN, thrombocytopenia.    Currently in Pain? Yes    Pain Score 2     Pain Location Knee    Pain Orientation Left    Pain Descriptors / Indicators Aching    Pain Type Surgical pain    Pain Onset More than a month ago              [facility] PT Assessment - 02/08/20 0001      Assessment   Medical Diagnosis Left total knee replacement.    Referring Provider (PT) Arther Abbott MD    Onset Date/Surgical Date  12/12/19    Next MD Visit 02/09/2020      Precautions   Precautions None      Restrictions   Weight Bearing Restrictions No      Balance Screen   Has the patient fallen in the past 6 months No    Is the patient reluctant to leave their home because of a fear of falling?  No      Observation/Other Assessments   Focus on Therapeutic Outcomes (FOTO)  65% limitaition on initial intake, currently pt is at 35% limitation      ROM / Strength   AROM / PROM / Strength Strength      AROM   AROM Assessment Site Knee    Right/Left Knee Right;Left    Left Knee Extension 10    Left Knee Flexion 105                         [facility] Adult PT Treatment/Exercise - 02/08/20 0001      Exercises   Exercises Knee/Hip      Knee/Hip Exercises: Aerobic   Recumbent Bike seat 9 x 10 minutes      Knee/Hip Exercises: Standing   Step Down Left;2 sets;10 reps;Hand Hold:  2;Step Height: 4"    Rocker Board 3 minutes   DF/PF , calf stretching   Other Standing Knee Exercises up and down 3 steps x 3 with single hand rail assistance      Knee/Hip Exercises: Supine   Bridges Strengthening;Both;10 reps    Straight Leg Raises Strengthening;15 reps      Modalities   Modalities Vasopneumatic      Vasopneumatic   Number Minutes Vasopneumatic  10 minutes    Vasopnuematic Location  Knee    Vasopneumatic Pressure Low    Vasopneumatic Temperature  34 edema      Manual Therapy   Manual Therapy Passive ROM    Manual therapy comments 10 minutes    Passive ROM over pressure into extension, limited by pain, Flexion                       PT Long Term Goals - 02/08/20 1609      PT LONG TERM GOAL #1   Title Independent with a HEP.    Period Weeks    Status Achieved      PT LONG TERM GOAL #2   Title Full right active knee extension in order to normalize gait.    Baseline pt lacking 10 degrees from full extension. 02/08/2020    Status On-going      PT LONG TERM GOAL #3   Title  Active knee flexion to 115 degrees+ so the patient can perform functional tasks and do so with pain not > 2-3/10.    Baseline AROM: 10-105 degrees    Status On-going      PT LONG TERM GOAL #4   Title Increase right knee strength to a solid 4+/5 to provide good stability for accomplishment of functional activities.    Baseline grossly 5/5 strength in bilateral LE"s    Status Achieved      PT LONG TERM GOAL #5   Title Perform a reciprocating stair gait with one railing with pain not > 2-3/10.    Baseline step to gait pattern, 02/08/2020    Status On-going                 Plan - 02/08/20 1633    Clinical Impression Statement Pt has made great progress with Physical Therapy with strength gains. Pt's AROM: 10-105 degrees. Pt still progressing toward more functional mobility and gait. Pt has improved his FOTO score from 65% limitation to 35% limitation. Pt has met 2 out of 5 LTG's set at initial evaluation. Still progressing toward improved pain with functional mobility and ROM. Due to high co-pay, I am requesting to see pt 1x/week for 6 additonal weeks as needed to progress toward goals not met.    Personal Factors and Comorbidities Comorbidity 1;Comorbidity 2    Comorbidities OA, right hip surgery, HTN, thrombocytopenia.    Examination-Activity Limitations Other;Locomotion Level;Stairs    Stability/Clinical Decision Making Stable/Uncomplicated    Rehab Potential Excellent    PT Frequency 1x / week    PT Duration 6 weeks    PT Treatment/Interventions ADLs/Self Care Home Management;Cryotherapy;Electrical Stimulation;Moist Heat;Gait training;Stair training;Functional mobility training;Therapeutic activities;Therapeutic exercise;Neuromuscular re-education;Manual techniques;Patient/family education;Passive range of motion;Vasopneumatic Device    PT Next Visit Plan Nustep and attempt bike.  Progress into TKA protocol. modalities PRN for pain relief and edema control    PT Home Exercise Plan  SLR, squats, hip abduction, heel slides, quad sets, prone knee hangs    Consulted and Agree with Plan of  Care Patient           Patient will benefit from skilled therapeutic intervention in order to improve the following deficits and impairments:  Pain, Increased edema, Decreased strength, Decreased activity tolerance, Decreased range of motion  Visit Diagnosis: Stiffness of left knee, not elsewhere classified  Chronic pain of left knee  Localized edema  Muscle weakness (generalized)     Problem List Patient Active Problem List   Diagnosis Date Noted   S/P TKR (total knee replacement), left 12/12/19 02/05/2020   Primary osteoarthritis of left knee    Thrombocytopenia ([place]) 12/31/2015   Onychomycosis 05/08/2013   Pain, foot 05/08/2013    Oretha Caprice, PT, MPT 02/08/2020, 4:55 PM  [facility] [address] [place], [place], [address] Phone: [phone]   Fax:  [phone]  Name: Donald Mclaughlin MRN: [id number] Date of Birth: 05/02/1946

## 2020-02-09 ENCOUNTER — Ambulatory Visit (INDEPENDENT_AMBULATORY_CARE_PROVIDER_SITE_OTHER): Payer: Medicare Other | Admitting: Orthopedic Surgery

## 2020-02-09 ENCOUNTER — Encounter: Payer: Self-pay | Admitting: Orthopedic Surgery

## 2020-02-09 DIAGNOSIS — G8918 Other acute postprocedural pain: Secondary | ICD-10-CM

## 2020-02-09 DIAGNOSIS — Z96652 Presence of left artificial knee joint: Secondary | ICD-10-CM

## 2020-02-09 MED ORDER — TRAMADOL HCL 50 MG PO TABS: 50.0000 mg | ORAL_TABLET | Freq: Four times a day (QID) | ORAL | 5 refills | Status: DC | PRN

## 2020-02-09 MED ORDER — HYDROCODONE-ACETAMINOPHEN 5-325 MG PO TABS: 1.0000 | ORAL_TABLET | Freq: Four times a day (QID) | ORAL | 0 refills | Status: DC | PRN

## 2020-02-09 NOTE — Patient Instructions (Signed)
Continue therapy

## 2020-02-09 NOTE — Progress Notes (Signed)
Chief Complaint  Patient presents with   Routine Post Op    12/12/19 left total knee replacement    Ulises continues to improve.  He is improved his knee flexion contracture to about 3 degrees of flexion is 115 degrees  We are going to taper  his hydrocodone continue his tramadol continue his therapy  Meds ordered this encounter  Medications   traMADol (ULTRAM) 50 MG tablet    Sig: Take 1 tablet (50 mg total) by mouth every 6 (six) hours as needed.    Dispense:  60 tablet    Refill:  5   HYDROcodone-acetaminophen (NORCO/VICODIN) 5-325 MG tablet    Sig: Take 1 tablet by mouth every 6 (six) hours as needed for moderate pain.    Dispense:  30 tablet    Refill:  0   F/u 1 month   Encounter Diagnoses  Name Primary?   S/P TKR (total knee replacement), left 12/12/19 Yes   Post-op pain

## 2020-02-15 ENCOUNTER — Encounter: Payer: Self-pay | Admitting: *Deleted

## 2020-02-15 ENCOUNTER — Other Ambulatory Visit: Payer: Self-pay

## 2020-02-15 ENCOUNTER — Ambulatory Visit: Payer: Medicare Other | Admitting: *Deleted

## 2020-02-15 DIAGNOSIS — R600 Localized edema: Secondary | ICD-10-CM | POA: Diagnosis not present

## 2020-02-15 DIAGNOSIS — M25662 Stiffness of left knee, not elsewhere classified: Secondary | ICD-10-CM

## 2020-02-15 DIAGNOSIS — M6281 Muscle weakness (generalized): Secondary | ICD-10-CM | POA: Diagnosis not present

## 2020-02-15 DIAGNOSIS — M25562 Pain in left knee: Secondary | ICD-10-CM | POA: Diagnosis not present

## 2020-02-15 DIAGNOSIS — G8929 Other chronic pain: Secondary | ICD-10-CM | POA: Diagnosis not present

## 2020-02-15 NOTE — Therapy (Addendum)
[facility] [place], [place], [address] Phone: [phone]   Fax:  [phone]  Physical Therapy Treatment PHYSICAL THERAPY DISCHARGE SUMMARY  Visits from Start of Care: 10  Current functional level related to goals / functional outcomes: See below   Remaining deficits: See goals   Education / Equipment: HEP Plan: Patient agrees to discharge.  Patient goals were met. Patient is being discharged due to being pleased with the current functional level.  ?????     Patient Details  Name: Donald Mclaughlin MRN: [id number] Date of Birth: 1946/01/16 Referring Provider (PT): Arther Abbott MD   Encounter Date: 02/15/2020   PT End of Session - 02/15/20 1616    Visit Number 10    Number of Visits 18    Date for PT Re-Evaluation 02/14/20    Authorization Type FOTO.  02-08-20 35% 9th visit,    PT Start Time 1600    PT Stop Time 1649    PT Time Calculation (min) 49 min           Past Medical History:  Diagnosis Date  . Arthritis   . Diabetes mellitus without complication ([place])   . High blood pressure   . Thrombocytopenia ([place]) 12/31/2015    Past Surgical History:  Procedure Laterality Date  . HIP SURGERY Right 2013   arthroplasty  . TOTAL KNEE ARTHROPLASTY Left 12/12/2019   Procedure: TOTAL KNEE ARTHROPLASTY/ Left;  Surgeon: Carole Civil, MD;  Location: [facility];  Service: Orthopedics;  Laterality: Left;    There were no vitals filed for this visit.                      [place] Adult PT Treatment/Exercise - 02/15/20 0001      Exercises   Exercises Knee/Hip      Knee/Hip Exercises: Aerobic   Recumbent Bike seat 8 x 76minutes    Nustep Level 5 x 10 minutes moving seat forward x 2      Knee/Hip Exercises: Standing   Forward Lunges Left;2 sets;10 reps;5 seconds    Lateral Step Up 2 sets;10 reps;Step Height: 4";Hand Hold: 2    Step Down Left;1 set;10 reps;Hand Hold: 2;Step Height: 4"    Rocker  Board 3 minutes   DF/PF , calf stretching     Modalities   Modalities Vasopneumatic      Vasopneumatic   Number Minutes Vasopneumatic  10 minutes    Vasopnuematic Location  Knee    Vasopneumatic Pressure Low    Vasopneumatic Temperature  34 edema      Manual Therapy   Manual Therapy Passive ROM    Passive ROM Gentle over pressure into extension, limited by pain,                       PT Long Term Goals - 02/08/20 1609      PT LONG TERM GOAL #1   Title Independent with a HEP.    Period Weeks    Status Achieved      PT LONG TERM GOAL #2   Title Full right active knee extension in order to normalize gait.    Baseline pt lacking 10 degrees from full extension. 02/08/2020    Status On-going      PT LONG TERM GOAL #3   Title Active knee flexion to 115 degrees+ so the patient can perform functional tasks and do so with pain not > 2-3/10.    Baseline  AROM: 10-105 degrees    Status On-going      PT LONG TERM GOAL #4   Title Increase right knee strength to a solid 4+/5 to provide good stability for accomplishment of functional activities.    Baseline grossly 5/5 strength in bilateral LE"s    Status Achieved      PT LONG TERM GOAL #5   Title Perform a reciprocating stair gait with one railing with pain not > 2-3/10.    Baseline step to gait pattern, 02/08/2020    Status On-going                 Plan - 02/15/20 1622    Clinical Impression Statement Pt arrived today with increased soreness, but doing well. Rx focused on LT knee ROM as well as strengthening. Pt mainly had lower calf soreness today as well as mm fatigue. Normal modality response today    Personal Factors and Comorbidities Comorbidity 1;Comorbidity 2    Comorbidities OA, right hip surgery, HTN, thrombocytopenia.    Examination-Participation Restrictions Other    Stability/Clinical Decision Making Stable/Uncomplicated    Rehab Potential Excellent    PT Frequency 1x / week    PT Duration 6 weeks     PT Treatment/Interventions ADLs/Self Care Home Management;Cryotherapy;Electrical Stimulation;Moist Heat;Gait training;Stair training;Functional mobility training;Therapeutic activities;Therapeutic exercise;Neuromuscular re-education;Manual techniques;Patient/family education;Passive range of motion;Vasopneumatic Device    PT Next Visit Plan Nustep and attempt bike.  Progress into TKA protocol. modalities PRN for pain relief and edema control    PT Home Exercise Plan SLR, squats, hip abduction, heel slides, quad sets, prone knee hangs    Consulted and Agree with Plan of Care Patient           Patient will benefit from skilled therapeutic intervention in order to improve the following deficits and impairments:  Pain, Increased edema, Decreased strength, Decreased activity tolerance, Decreased range of motion  Visit Diagnosis: Stiffness of left knee, not elsewhere classified  Chronic pain of left knee  Localized edema  Muscle weakness (generalized)     Problem List Patient Active Problem List   Diagnosis Date Noted  . S/P TKR (total knee replacement), left 12/12/19 02/05/2020  . Primary osteoarthritis of left knee   . Thrombocytopenia ([place]) 12/31/2015  . Onychomycosis 05/08/2013  . Pain, foot 05/08/2013    Rashaun Curl,CHRIS, PTA 02/15/2020, 6:02 PM  [facility] [address] [place], [place], [address] Phone: [phone]   Fax:  [phone]  Name: Donald Mclaughlin MRN: [id number] Date of Birth: 1946/02/02

## 2020-02-19 DIAGNOSIS — E1165 Type 2 diabetes mellitus with hyperglycemia: Secondary | ICD-10-CM | POA: Diagnosis not present

## 2020-02-19 DIAGNOSIS — E785 Hyperlipidemia, unspecified: Secondary | ICD-10-CM | POA: Diagnosis not present

## 2020-02-27 ENCOUNTER — Ambulatory Visit: Payer: Medicare Other | Admitting: Physical Therapy

## 2020-02-29 ENCOUNTER — Other Ambulatory Visit: Payer: Self-pay | Admitting: Orthopedic Surgery

## 2020-02-29 DIAGNOSIS — Z96652 Presence of left artificial knee joint: Secondary | ICD-10-CM

## 2020-02-29 DIAGNOSIS — G8918 Other acute postprocedural pain: Secondary | ICD-10-CM

## 2020-02-29 NOTE — Telephone Encounter (Signed)
Patient requests refill on Hydrocodone/Acetaminophen 5-325  Mgs.  Qty  30  Sig: Take 1 tablet by mouth every 6 (six) hours as needed for moderate pain.  Patient states he uses [facility] in [place]

## 2020-03-06 MED ORDER — HYDROCODONE-ACETAMINOPHEN 5-325 MG PO TABS: 1.0000 | ORAL_TABLET | Freq: Four times a day (QID) | ORAL | 0 refills | Status: DC | PRN

## 2020-03-11 ENCOUNTER — Ambulatory Visit: Payer: Medicare Other | Admitting: Orthopedic Surgery

## 2020-03-14 ENCOUNTER — Ambulatory Visit: Payer: Medicare Other | Admitting: Orthopedic Surgery

## 2020-03-21 DIAGNOSIS — E1165 Type 2 diabetes mellitus with hyperglycemia: Secondary | ICD-10-CM | POA: Diagnosis not present

## 2020-03-21 DIAGNOSIS — I10 Essential (primary) hypertension: Secondary | ICD-10-CM | POA: Diagnosis not present

## 2020-04-01 ENCOUNTER — Ambulatory Visit: Payer: Medicare Other | Admitting: Orthopedic Surgery

## 2020-04-11 ENCOUNTER — Other Ambulatory Visit: Payer: Self-pay

## 2020-04-11 ENCOUNTER — Encounter: Payer: Self-pay | Admitting: Orthopedic Surgery

## 2020-04-11 ENCOUNTER — Ambulatory Visit (INDEPENDENT_AMBULATORY_CARE_PROVIDER_SITE_OTHER): Payer: Medicare Other | Admitting: Orthopedic Surgery

## 2020-04-11 VITALS — Ht 65.0 in | Wt 202.0 lb

## 2020-04-11 DIAGNOSIS — Z96652 Presence of left artificial knee joint: Secondary | ICD-10-CM

## 2020-04-11 DIAGNOSIS — G8918 Other acute postprocedural pain: Secondary | ICD-10-CM | POA: Diagnosis not present

## 2020-04-11 MED ORDER — TRAMADOL HCL 50 MG PO TABS: 50.0000 mg | ORAL_TABLET | Freq: Four times a day (QID) | ORAL | 5 refills | Status: DC | PRN

## 2020-04-11 NOTE — Progress Notes (Signed)
Chief Complaint  Patient presents with  . Routine Post Op    Lt TKR DOS 12/12/19    POST OP 4 MONTHS Donald Mclaughlin was seen today for routine post op.  Diagnoses and all orders for this visit:  S/P TKR (total knee replacement), left 12/12/19  Post-op pain -     traMADol (ULTRAM) 50 MG tablet; Take 1 tablet (50 mg total) by mouth every 6 (six) hours as needed.    74 year old male with follow-up with [place] in a year he is doing well he has some occasional aching and swelling depending on activity and weather  His knee looks very good he may have just a slight residual flexion contracture is got to be less than 5 degrees flexes the knee 110 degrees walks without any support  Recommend 1 year follow-up annual film  We refilled some tramadol for him in case he has some discomfort in the knee.

## 2020-04-15 ENCOUNTER — Other Ambulatory Visit: Payer: Self-pay | Admitting: Orthopedic Surgery

## 2020-04-15 DIAGNOSIS — G8918 Other acute postprocedural pain: Secondary | ICD-10-CM

## 2020-04-15 DIAGNOSIS — Z96652 Presence of left artificial knee joint: Secondary | ICD-10-CM

## 2020-04-15 MED ORDER — HYDROCODONE-ACETAMINOPHEN 5-325 MG PO TABS: 1.0000 | ORAL_TABLET | Freq: Two times a day (BID) | ORAL | 0 refills | Status: AC

## 2020-04-15 NOTE — Telephone Encounter (Signed)
Mr. Maxcy left message stating that Tramadol constipates him and that he would like for Dr Romeo Apple to call in prescription for maybe 10-15  Hydrocodone/Acetaminophen 5-325 mgs. To [facility]

## 2020-04-20 DIAGNOSIS — E1165 Type 2 diabetes mellitus with hyperglycemia: Secondary | ICD-10-CM | POA: Diagnosis not present

## 2020-04-20 DIAGNOSIS — M1712 Unilateral primary osteoarthritis, left knee: Secondary | ICD-10-CM | POA: Diagnosis not present

## 2020-05-21 DIAGNOSIS — I10 Essential (primary) hypertension: Secondary | ICD-10-CM | POA: Diagnosis not present

## 2020-05-21 DIAGNOSIS — E1165 Type 2 diabetes mellitus with hyperglycemia: Secondary | ICD-10-CM | POA: Diagnosis not present

## 2020-05-27 ENCOUNTER — Other Ambulatory Visit: Payer: Self-pay | Admitting: Orthopedic Surgery

## 2020-05-27 NOTE — Telephone Encounter (Signed)
Patient requests refill on Hydrocodone/Acetaminophen 5-325 mgs.  Qty  10  Sig: Take 1 tablet by mouth in the morning and at bedtime for 5 days.  Patient states he uses [facility] in [place]

## 2020-05-29 ENCOUNTER — Telehealth: Payer: Self-pay | Admitting: Orthopedic Surgery

## 2020-05-29 IMAGING — MR MR KNEE*L* W/O CM
4 of 6 series · 29 of 40 positions shown · non-contrast
Comparison: Radiographs 09/10/2016

CLINICAL DATA: Chronic knee pain swelling. No specific injury.

EXAM:
MRI OF THE LEFT KNEE WITHOUT CONTRAST
TECHNIQUE: Multiplanar, multisequence MR imaging of the knee was performed. No
intravenous contrast was administered.

[Series 4: T1 · coronal · 4.0mm · 0.31mm/px · 6 of 30 slices shown]
[im 1/30]
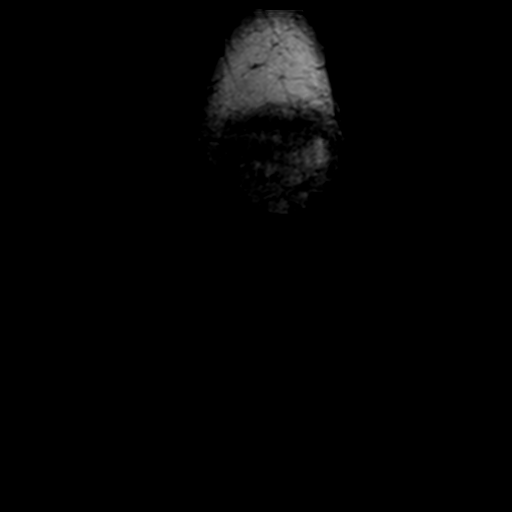
[im 6/30]
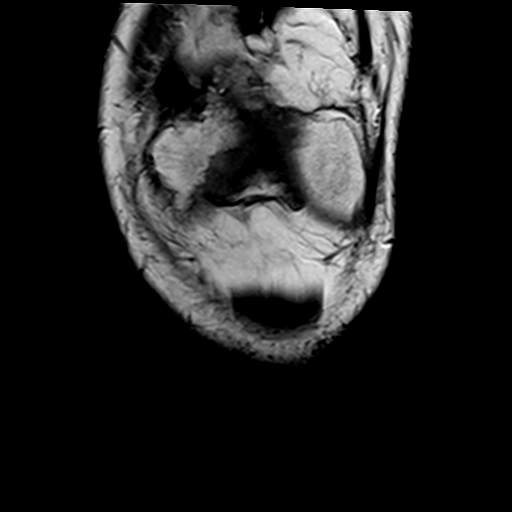
[im 12/30]
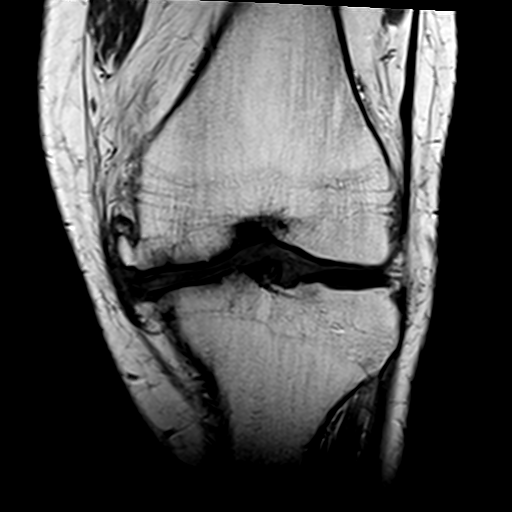
[im 18/30]
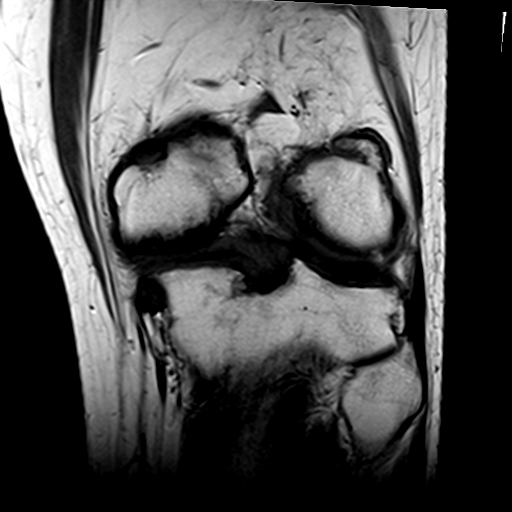
[im 24/30]
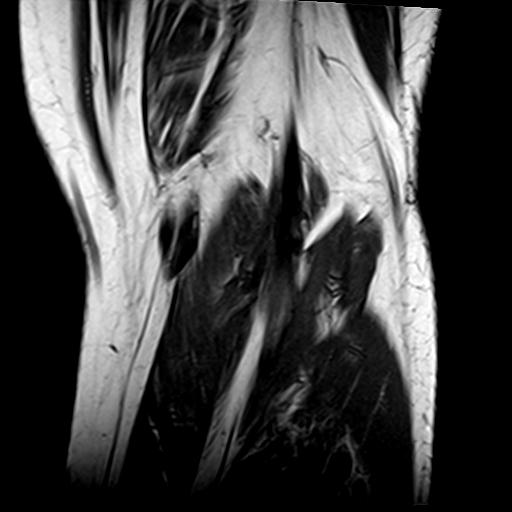
[im 30/30]
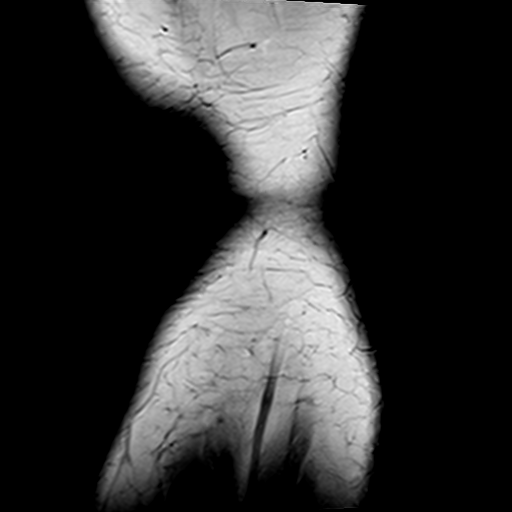

[Series 5: pdfs sag · sagittal · 3.0mm · 0.54mm/px · 7 of 29 slices shown]
[im 1/29]
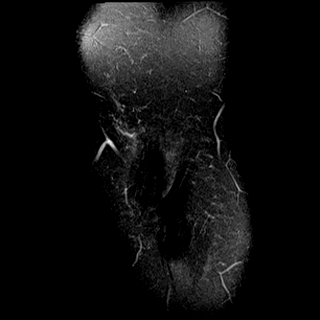
[im 5/29]
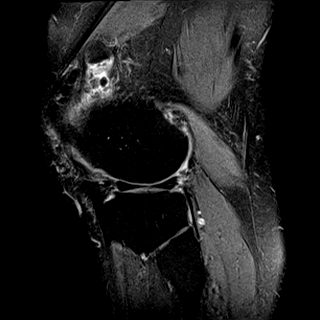
[im 10/29]
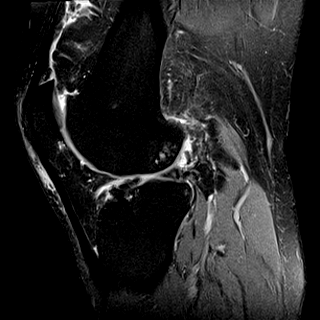
[im 15/29]
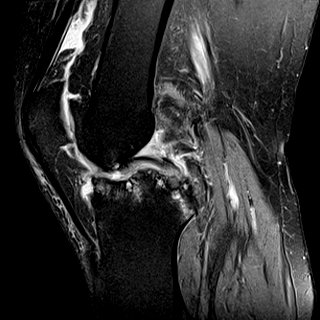
[im 19/29]
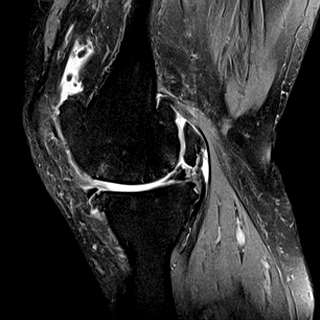
[im 24/29]
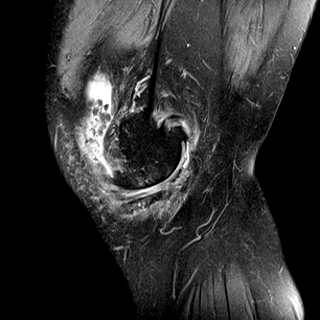
[im 29/29]
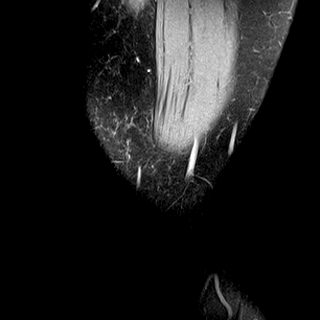

[Series 6: pdfs cor · coronal · 3.0mm · 0.59mm/px · 9 of 38 slices shown]
[im 1/38]
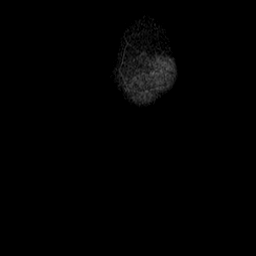
[im 5/38]
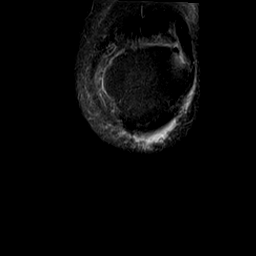
[im 10/38]
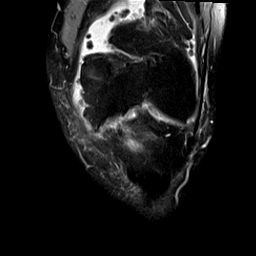
[im 14/38]
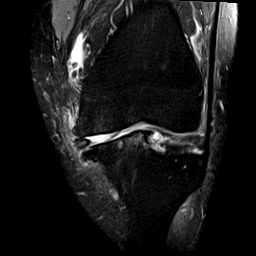
[im 19/38]
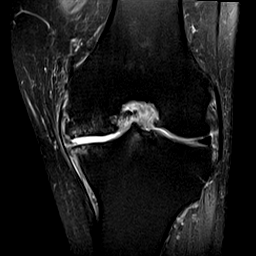
[im 24/38]
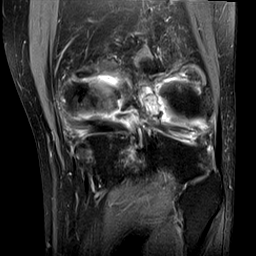
[im 28/38]
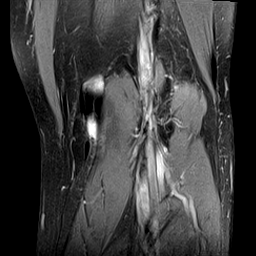
[im 33/38]
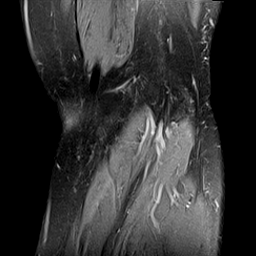
[im 38/38]
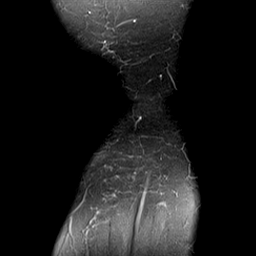

[Series 7: t2fs cor · coronal · 4.0mm · 0.31mm/px · 7 of 30 slices shown]
[im 1/30]
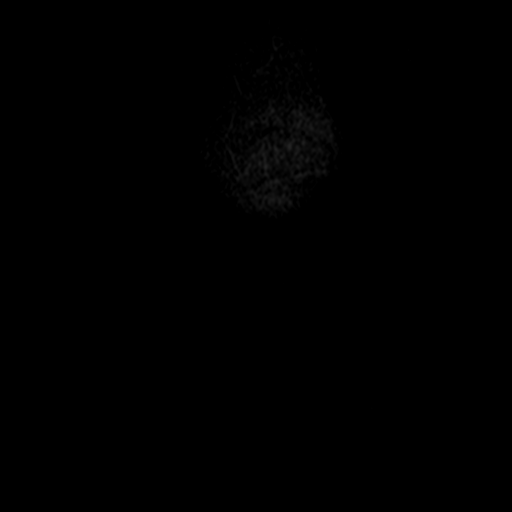
[im 5/30]
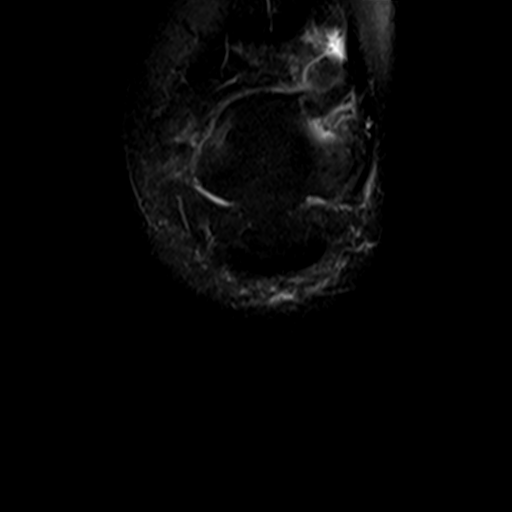
[im 10/30]
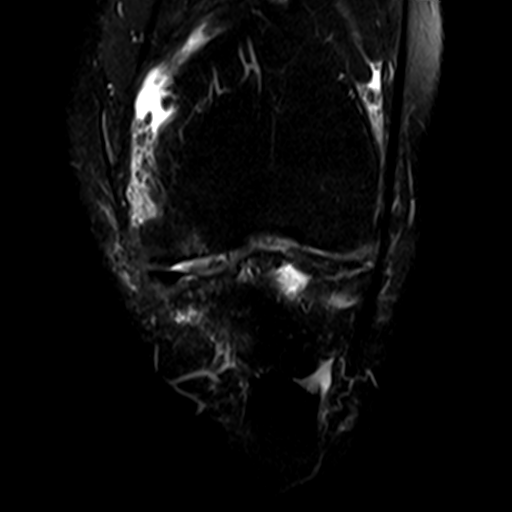
[im 15/30]
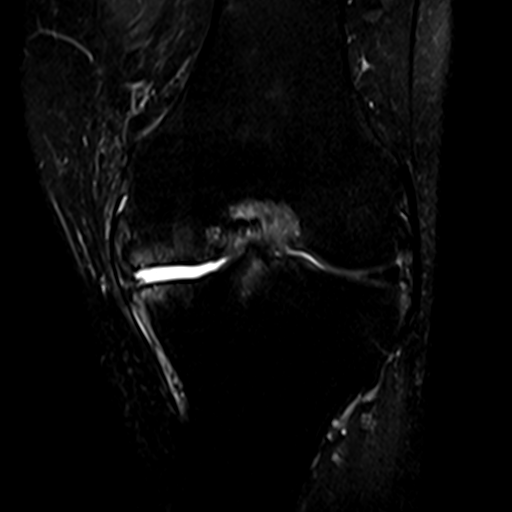
[im 20/30]
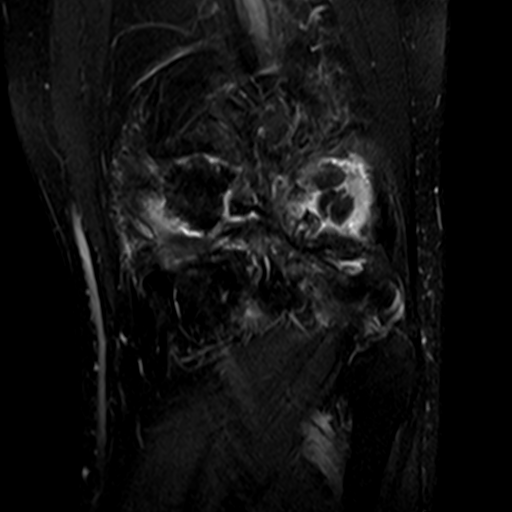
[im 25/30]
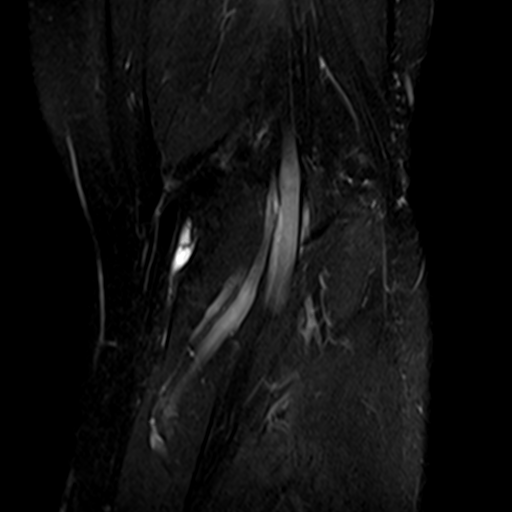
[im 30/30]
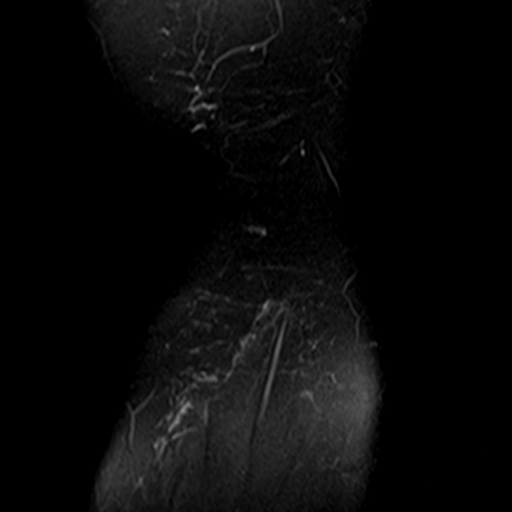

[29 of 40 positions shown; findings below may reference images not displayed]

FINDINGS: MENISCI

Medial meniscus: Severely degenerated and torn along the posterior
horn and mid body region.

Lateral meniscus:  Degenerative changes but no discrete tear.

LIGAMENTS

Cruciates: Advanced mucoid degeneration of the ACL and PCL without
definite tear/rupture.

Collaterals:  Intact. MCL and pes anserine bursitis noted.

CARTILAGE

Patellofemoral: Moderate to advanced degenerative chondrosis with
moderate cartilage thinning, joint space narrowing and spurring.

Medial: Severe degenerative chondrosis with full-thickness cartilage
loss, osteophytic spurring and subchondral cystic change.

Lateral: Moderate to advanced degenerative chondrosis with spurring.

Joint: Moderate-sized joint effusion. Moderate synovitis and
numerous small fatty lobules throughout the joint suggesting lipoma
arborescens. There is also a 2 cm loose ossified body in the
posterior joint space medially.

Popliteal Fossa:  No popliteal mass. Small Baker's cyst.

Extensor Mechanism: The patella retinacular structures are intact
and the quadriceps and patellar tendons are intact.

Bones:  No acute bony findings.

Other: Normal knee musculature.
IMPRESSION: 1. Severely degenerated and torn medial meniscus.
2. Advanced mucoid degeneration of the ACL and PCL.
3. Significant tricompartmental degenerative changes most notable in
the medial compartment.
4. Moderate-sized joint effusion, moderate synovitis and findings of
lipoma arborescens. There is a 2 cm loose ossified body in the
posterior joint space.
5. MCL and pes anserine bursitis.

## 2020-05-29 NOTE — Telephone Encounter (Signed)
HYDROcodone-acetaminophen (NORCO) 10-325 MG tablet     Sig: Take 1 tablet by mouth every 4 (four) hours as needed.    Dispense:  42 tablet    Refill:  0   Patient uses [facility] in [place]

## 2020-05-29 NOTE — Telephone Encounter (Signed)
Dr Romeo Apple does not want him to use the Hydrocodone. I have advised him to use Tylenol instead at this point and he has voiced understanding.

## 2020-06-26 DIAGNOSIS — E1165 Type 2 diabetes mellitus with hyperglycemia: Secondary | ICD-10-CM | POA: Diagnosis not present

## 2020-06-26 DIAGNOSIS — I10 Essential (primary) hypertension: Secondary | ICD-10-CM | POA: Diagnosis not present

## 2020-06-26 DIAGNOSIS — E785 Hyperlipidemia, unspecified: Secondary | ICD-10-CM | POA: Diagnosis not present

## 2020-07-27 DIAGNOSIS — E1165 Type 2 diabetes mellitus with hyperglycemia: Secondary | ICD-10-CM | POA: Diagnosis not present

## 2020-07-27 DIAGNOSIS — I10 Essential (primary) hypertension: Secondary | ICD-10-CM | POA: Diagnosis not present

## 2020-08-13 IMAGING — DX DG KNEE 1-2V PORT*L*
2 series · 2 of 2 positions shown · non-contrast
Comparison: None.

CLINICAL DATA: Status post left knee replacement

EXAM:
PORTABLE LEFT KNEE - 2 VIEW

[knee lat (1 of 2)]
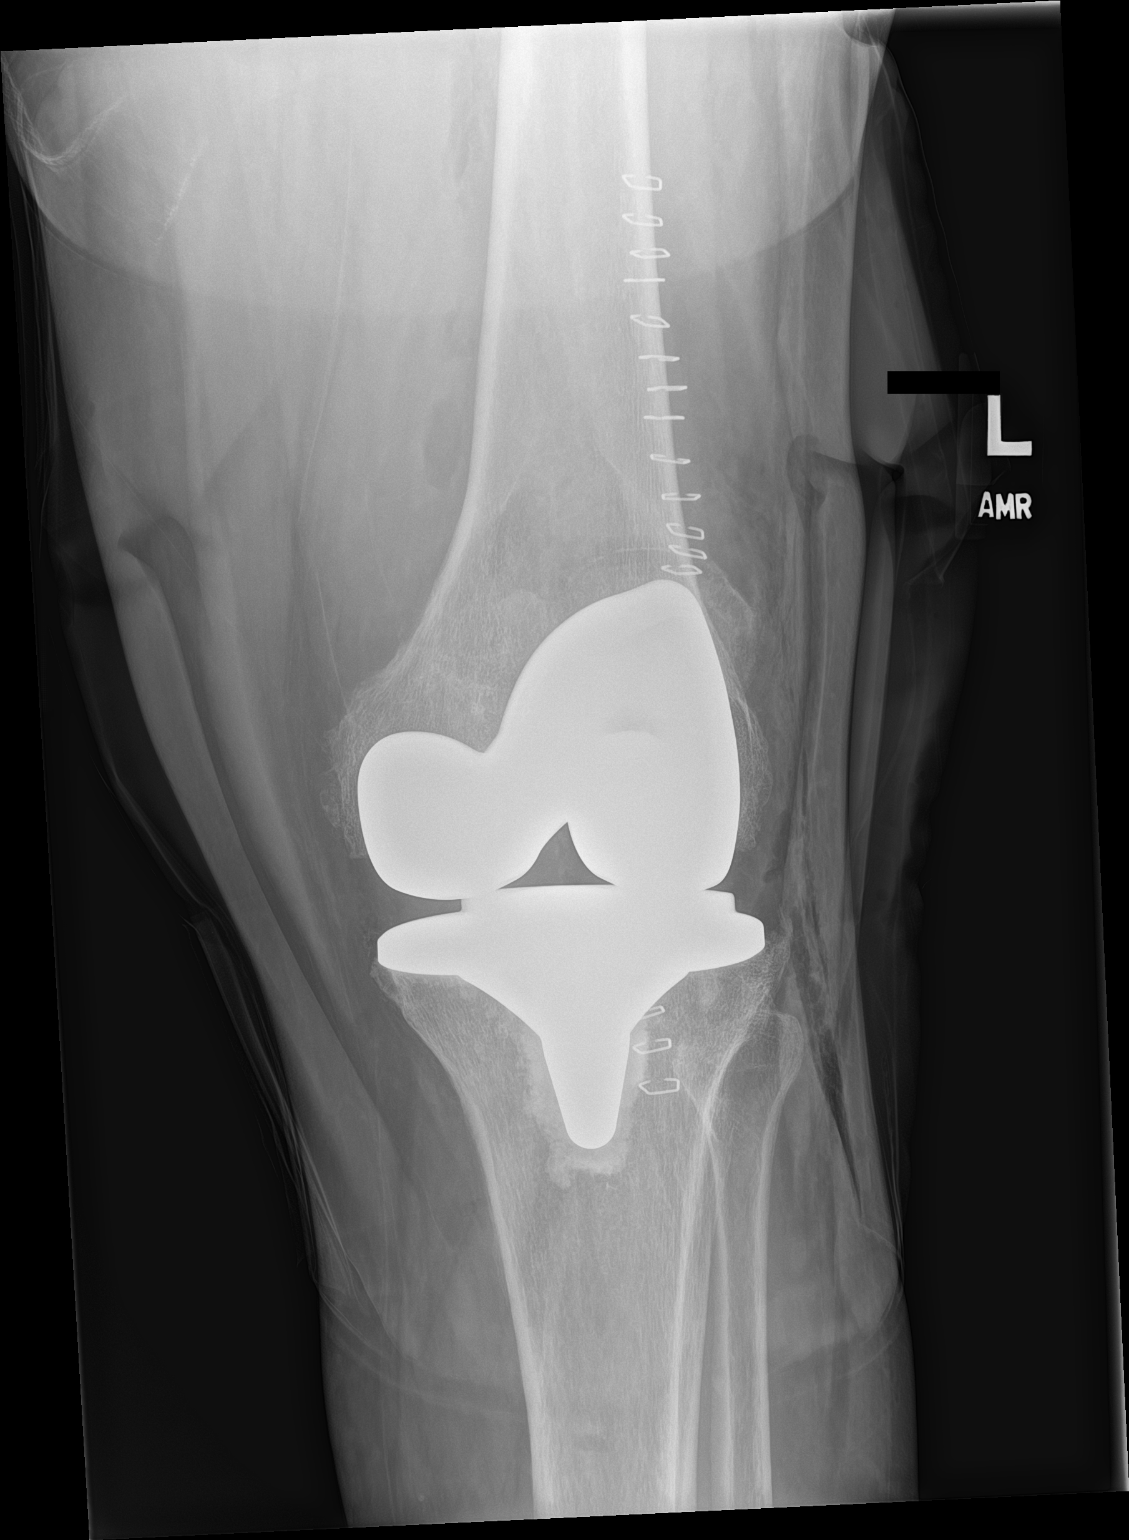

[knee lat (2 of 2)]
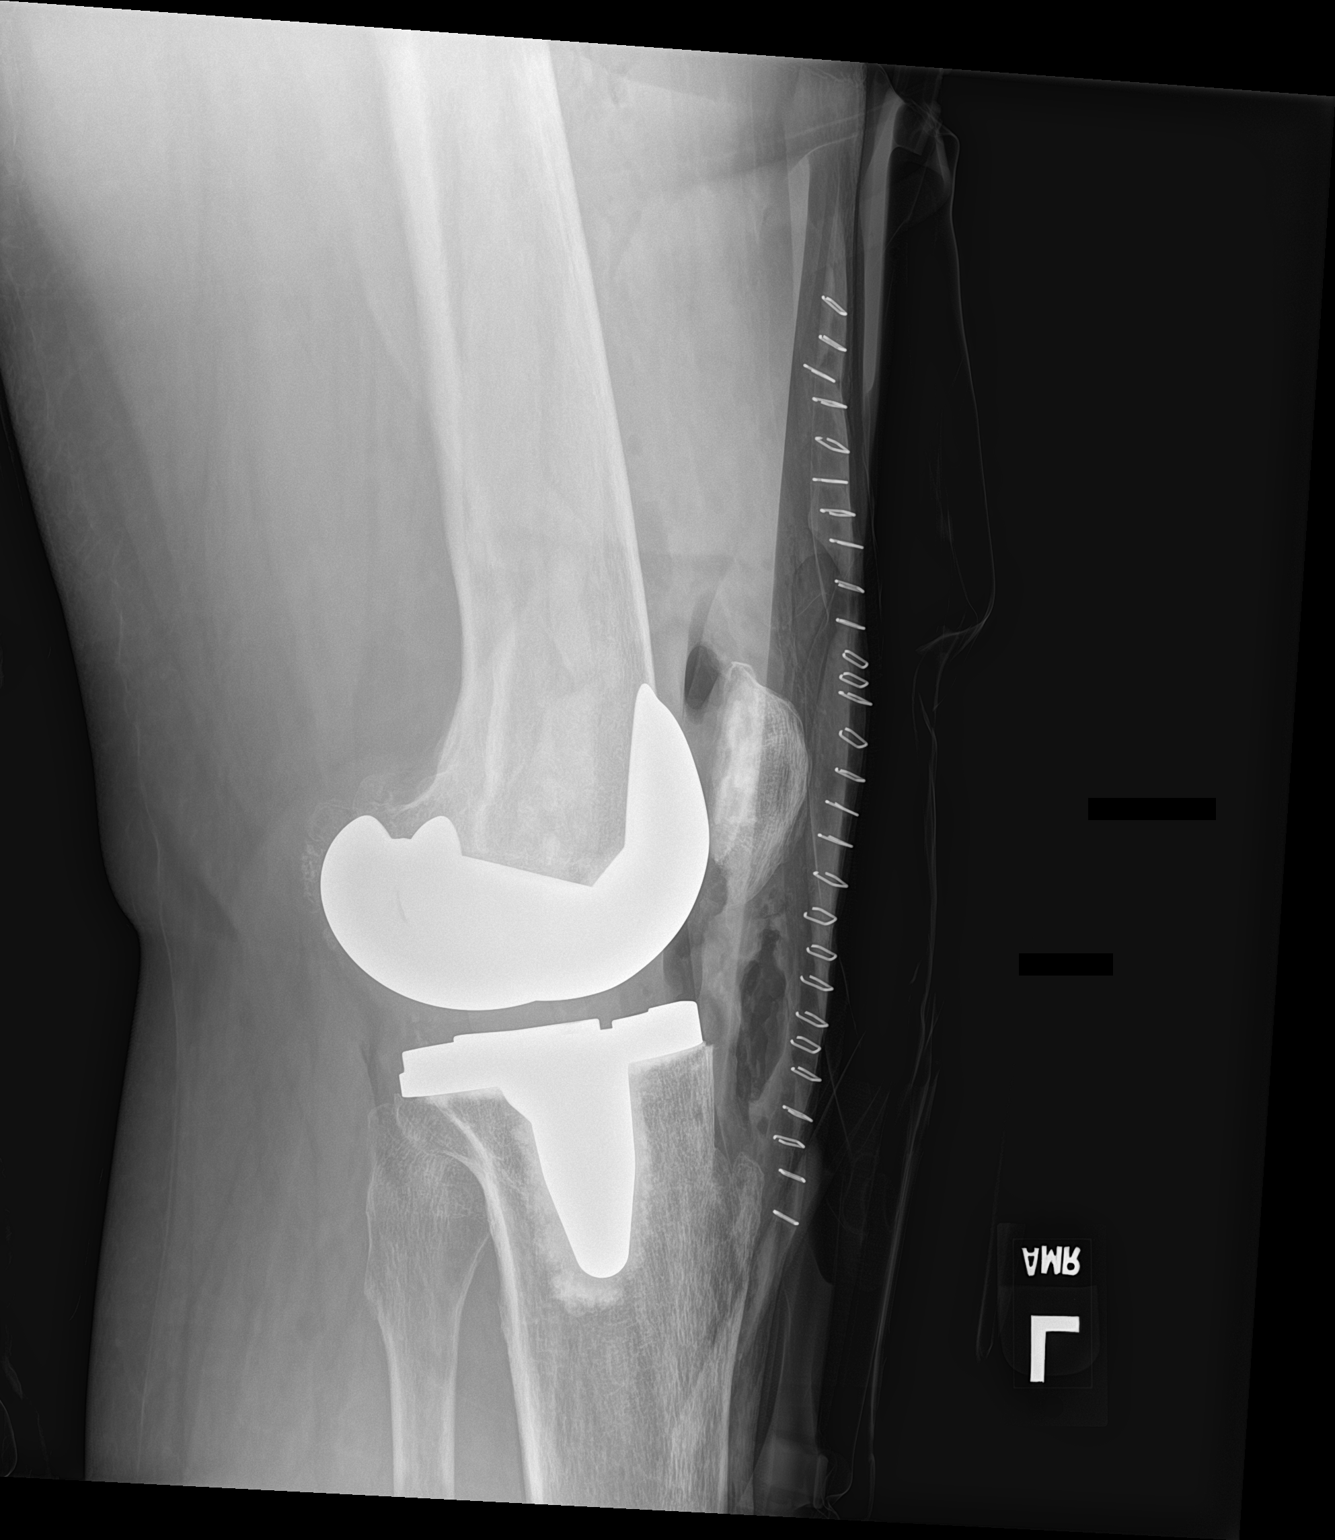

[2 of 2 positions shown; findings below may reference images not displayed]

FINDINGS: Left knee replacement is noted. No acute bony or soft tissue
abnormality is seen
IMPRESSION: Left knee prosthesis in satisfactory position.

## 2020-08-26 DIAGNOSIS — I10 Essential (primary) hypertension: Secondary | ICD-10-CM | POA: Diagnosis not present

## 2020-08-26 DIAGNOSIS — E1165 Type 2 diabetes mellitus with hyperglycemia: Secondary | ICD-10-CM | POA: Diagnosis not present

## 2020-09-23 DIAGNOSIS — I10 Essential (primary) hypertension: Secondary | ICD-10-CM | POA: Diagnosis not present

## 2020-09-23 DIAGNOSIS — E1165 Type 2 diabetes mellitus with hyperglycemia: Secondary | ICD-10-CM | POA: Diagnosis not present

## 2020-10-24 DIAGNOSIS — I10 Essential (primary) hypertension: Secondary | ICD-10-CM | POA: Diagnosis not present

## 2020-10-24 DIAGNOSIS — E1165 Type 2 diabetes mellitus with hyperglycemia: Secondary | ICD-10-CM | POA: Diagnosis not present

## 2020-12-12 ENCOUNTER — Ambulatory Visit: Payer: Medicare Other | Admitting: Orthopedic Surgery

## 2021-01-02 ENCOUNTER — Encounter: Payer: Self-pay | Admitting: Orthopedic Surgery

## 2021-01-02 ENCOUNTER — Other Ambulatory Visit: Payer: Self-pay

## 2021-01-02 ENCOUNTER — Ambulatory Visit: Payer: Medicare Other | Admitting: Orthopedic Surgery

## 2021-01-02 ENCOUNTER — Ambulatory Visit: Payer: Medicare Other

## 2021-01-02 VITALS — BP 126/88 | HR 80 | Ht 65.0 in | Wt 210.0 lb

## 2021-01-02 DIAGNOSIS — M1712 Unilateral primary osteoarthritis, left knee: Secondary | ICD-10-CM | POA: Diagnosis not present

## 2021-01-02 DIAGNOSIS — Z96652 Presence of left artificial knee joint: Secondary | ICD-10-CM

## 2021-01-02 NOTE — Progress Notes (Signed)
FOLLOW UP   Encounter Diagnosis  Name Primary?   S/P TKR (total knee replacement), left Yes     Chief Complaint  Patient presents with   Routine Post Op    S/p TKR 12/12/19     75 year old male 1 year status post left total knee only complaint is some swelling.  He says he gets up and down well walks well not having any pain  Exam shows that his left knee is swollen compared to the right but there is no effusion all the swellings in the soft tissue his extension is out to 5 degrees his flexion is 125 degrees the knee is stable his strength is excellent incision looks good there is no erythema  X-rays look normal   Rec 1 yr fu xrays and eval

## 2021-02-27 ENCOUNTER — Other Ambulatory Visit (HOSPITAL_COMMUNITY): Payer: Self-pay | Admitting: Internal Medicine

## 2021-02-27 DIAGNOSIS — R059 Cough, unspecified: Secondary | ICD-10-CM

## 2021-03-12 ENCOUNTER — Ambulatory Visit (HOSPITAL_COMMUNITY)
Admission: RE | Admit: 2021-03-12 | Discharge: 2021-03-12 | Disposition: A | Discharge status: Home / Self Care | Payer: Medicare Other | Source: Ambulatory Visit | Attending: Internal Medicine | Admitting: Internal Medicine

## 2021-03-12 ENCOUNTER — Other Ambulatory Visit: Payer: Self-pay

## 2021-03-12 DIAGNOSIS — R059 Cough, unspecified: Secondary | ICD-10-CM | POA: Diagnosis not present

## 2021-07-17 ENCOUNTER — Ambulatory Visit: Payer: Medicare Other | Admitting: Orthopedic Surgery

## 2022-01-05 ENCOUNTER — Ambulatory Visit: Payer: Medicare Other | Admitting: Orthopedic Surgery

## 2022-01-29 ENCOUNTER — Ambulatory Visit: Payer: Medicare Other | Admitting: Orthopedic Surgery

## 2022-01-29 ENCOUNTER — Encounter: Payer: Self-pay | Admitting: Orthopedic Surgery

## 2022-01-29 ENCOUNTER — Ambulatory Visit (INDEPENDENT_AMBULATORY_CARE_PROVIDER_SITE_OTHER): Payer: Medicare Other

## 2022-01-29 DIAGNOSIS — Z96652 Presence of left artificial knee joint: Secondary | ICD-10-CM

## 2022-01-29 DIAGNOSIS — M1712 Unilateral primary osteoarthritis, left knee: Secondary | ICD-10-CM

## 2022-01-29 NOTE — Progress Notes (Signed)
Chief Complaint  Patient presents with   Post-op Follow-up    Left knee replacement 12/12/19 states feels good  walking good but still swollen     76 year old male 2-year follow-up of the left total knee  Only complaint is intermittent swelling left knee  Patient mows the lawn has no pain walks without support doing very well gets up and down without any issues  Left knee does have an effusion feels slightly warm to touch feels very stable in extension may have a little laxity in flexion but nothing that I would think was unusual  His active motion is excellent 0-1 20  X-ray shows no loosening but I do see the effusion  I recommend a lab work-up CBC sed rate C-reactive protein to evaluate the effusion  I will call the results to him and otherwise see him in a year

## 2022-01-29 NOTE — Patient Instructions (Signed)
Dr Rexene Edison will call results to you

## 2022-01-30 LAB — CBC WITH DIFFERENTIAL/PLATELET
Absolute Monocytes: 474 cells/uL (ref 200–950)
Basophils Absolute: 19 cells/uL (ref 0–200)
Basophils Relative: 0.3 %
Eosinophils Absolute: 83 cells/uL (ref 15–500)
Eosinophils Relative: 1.3 %
HCT: 39.1 % (ref 38.5–50.0)
Hemoglobin: 13 g/dL — ABNORMAL LOW (ref 13.2–17.1)
Lymphs Abs: 1984 cells/uL (ref 850–3900)
MCH: 27.4 pg (ref 27.0–33.0)
MCHC: 33.2 g/dL (ref 32.0–36.0)
MCV: 82.5 fL (ref 80.0–100.0)
MPV: 11.1 fL (ref 7.5–12.5)
Monocytes Relative: 7.4 %
Neutro Abs: 3840 cells/uL (ref 1500–7800)
Neutrophils Relative %: 60 %
Platelets: 177 10*3/uL (ref 140–400)
RBC: 4.74 10*6/uL (ref 4.20–5.80)
RDW: 13.7 % (ref 11.0–15.0)
Total Lymphocyte: 31 %
WBC: 6.4 10*3/uL (ref 3.8–10.8)

## 2022-01-30 LAB — C-REACTIVE PROTEIN: CRP: 8.5 mg/L — ABNORMAL HIGH (ref <8.0)

## 2022-01-30 LAB — SEDIMENTATION RATE: Sed Rate: 41 mm/h — ABNORMAL HIGH (ref 0–20)

## 2022-02-05 ENCOUNTER — Telehealth: Payer: Self-pay | Admitting: Orthopedic Surgery

## 2022-02-05 NOTE — Progress Notes (Signed)
Lab results reported to the patient  Sed rate of 41 C-reactive protein 8.5 with no clinical signs of infection labs will be repeated in 6 months

## 2022-02-05 NOTE — Telephone Encounter (Signed)
Phone call completed with Donald Mclaughlin  He had a sed rate and C-reactive protein due to persistent swelling in his right total knee after 2 years although he has no complaints and his knee is functioning fine x-rays look normal  His sed rate and C-reactive protein were elevated  Sed rate 41 normal 0-20 CRP less than 8 his was 8.5 hemoglobin 13 white count 6.4  Right now there is low suspicion for joint infection based on clinical picture  No aspiration has been done  Patient will let me know if the any symptoms change regarding his knee function and we will repeat his labs in 6 months

## 2022-07-19 DIAGNOSIS — I10 Essential (primary) hypertension: Secondary | ICD-10-CM | POA: Diagnosis not present

## 2022-07-19 DIAGNOSIS — E1165 Type 2 diabetes mellitus with hyperglycemia: Secondary | ICD-10-CM | POA: Diagnosis not present

## 2022-08-19 DIAGNOSIS — I10 Essential (primary) hypertension: Secondary | ICD-10-CM | POA: Diagnosis not present

## 2022-08-19 DIAGNOSIS — E1165 Type 2 diabetes mellitus with hyperglycemia: Secondary | ICD-10-CM | POA: Diagnosis not present

## 2022-09-17 DIAGNOSIS — I10 Essential (primary) hypertension: Secondary | ICD-10-CM | POA: Diagnosis not present

## 2022-09-17 DIAGNOSIS — E1165 Type 2 diabetes mellitus with hyperglycemia: Secondary | ICD-10-CM | POA: Diagnosis not present

## 2022-11-04 DIAGNOSIS — E1165 Type 2 diabetes mellitus with hyperglycemia: Secondary | ICD-10-CM | POA: Diagnosis not present

## 2022-11-04 DIAGNOSIS — I10 Essential (primary) hypertension: Secondary | ICD-10-CM | POA: Diagnosis not present

## 2022-11-04 DIAGNOSIS — Z0001 Encounter for general adult medical examination with abnormal findings: Secondary | ICD-10-CM | POA: Diagnosis not present

## 2022-11-04 DIAGNOSIS — Z1389 Encounter for screening for other disorder: Secondary | ICD-10-CM | POA: Diagnosis not present

## 2022-11-04 DIAGNOSIS — E785 Hyperlipidemia, unspecified: Secondary | ICD-10-CM | POA: Diagnosis not present

## 2022-11-04 DIAGNOSIS — E119 Type 2 diabetes mellitus without complications: Secondary | ICD-10-CM | POA: Diagnosis not present

## 2022-12-05 DIAGNOSIS — I10 Essential (primary) hypertension: Secondary | ICD-10-CM | POA: Diagnosis not present

## 2022-12-05 DIAGNOSIS — E1165 Type 2 diabetes mellitus with hyperglycemia: Secondary | ICD-10-CM | POA: Diagnosis not present

## 2023-01-04 DIAGNOSIS — E1165 Type 2 diabetes mellitus with hyperglycemia: Secondary | ICD-10-CM | POA: Diagnosis not present

## 2023-01-04 DIAGNOSIS — I10 Essential (primary) hypertension: Secondary | ICD-10-CM | POA: Diagnosis not present

## 2023-02-03 DIAGNOSIS — I10 Essential (primary) hypertension: Secondary | ICD-10-CM | POA: Diagnosis not present

## 2023-02-03 DIAGNOSIS — E1165 Type 2 diabetes mellitus with hyperglycemia: Secondary | ICD-10-CM | POA: Diagnosis not present

## 2023-02-15 ENCOUNTER — Ambulatory Visit: Payer: Medicare Other | Admitting: Orthopedic Surgery

## 2023-03-06 DIAGNOSIS — E1165 Type 2 diabetes mellitus with hyperglycemia: Secondary | ICD-10-CM | POA: Diagnosis not present

## 2023-03-06 DIAGNOSIS — I10 Essential (primary) hypertension: Secondary | ICD-10-CM | POA: Diagnosis not present

## 2023-04-05 DIAGNOSIS — I10 Essential (primary) hypertension: Secondary | ICD-10-CM | POA: Diagnosis not present

## 2023-04-05 DIAGNOSIS — E1165 Type 2 diabetes mellitus with hyperglycemia: Secondary | ICD-10-CM | POA: Diagnosis not present

## 2023-05-03 DIAGNOSIS — Z87891 Personal history of nicotine dependence: Secondary | ICD-10-CM | POA: Diagnosis not present

## 2023-05-03 DIAGNOSIS — R140 Abdominal distension (gaseous): Secondary | ICD-10-CM | POA: Diagnosis not present

## 2023-05-03 DIAGNOSIS — K565 Intestinal adhesions [bands], unspecified as to partial versus complete obstruction: Secondary | ICD-10-CM | POA: Diagnosis not present

## 2023-05-03 DIAGNOSIS — I10 Essential (primary) hypertension: Secondary | ICD-10-CM | POA: Diagnosis not present

## 2023-05-03 DIAGNOSIS — K9189 Other postprocedural complications and disorders of digestive system: Secondary | ICD-10-CM | POA: Diagnosis not present

## 2023-05-03 DIAGNOSIS — Z96652 Presence of left artificial knee joint: Secondary | ICD-10-CM | POA: Diagnosis not present

## 2023-05-03 DIAGNOSIS — R1011 Right upper quadrant pain: Secondary | ICD-10-CM | POA: Diagnosis not present

## 2023-05-03 DIAGNOSIS — Z4682 Encounter for fitting and adjustment of non-vascular catheter: Secondary | ICD-10-CM | POA: Diagnosis not present

## 2023-05-03 DIAGNOSIS — E1122 Type 2 diabetes mellitus with diabetic chronic kidney disease: Secondary | ICD-10-CM | POA: Diagnosis not present

## 2023-05-03 DIAGNOSIS — E119 Type 2 diabetes mellitus without complications: Secondary | ICD-10-CM | POA: Diagnosis not present

## 2023-05-03 DIAGNOSIS — I129 Hypertensive chronic kidney disease with stage 1 through stage 4 chronic kidney disease, or unspecified chronic kidney disease: Secondary | ICD-10-CM | POA: Diagnosis not present

## 2023-05-03 DIAGNOSIS — K3189 Other diseases of stomach and duodenum: Secondary | ICD-10-CM | POA: Diagnosis not present

## 2023-05-03 DIAGNOSIS — R9389 Abnormal findings on diagnostic imaging of other specified body structures: Secondary | ICD-10-CM | POA: Diagnosis not present

## 2023-05-03 DIAGNOSIS — K56699 Other intestinal obstruction unspecified as to partial versus complete obstruction: Secondary | ICD-10-CM | POA: Diagnosis not present

## 2023-05-03 DIAGNOSIS — Z8679 Personal history of other diseases of the circulatory system: Secondary | ICD-10-CM | POA: Diagnosis not present

## 2023-05-03 DIAGNOSIS — R109 Unspecified abdominal pain: Secondary | ICD-10-CM | POA: Diagnosis not present

## 2023-05-03 DIAGNOSIS — R0989 Other specified symptoms and signs involving the circulatory and respiratory systems: Secondary | ICD-10-CM | POA: Diagnosis not present

## 2023-05-03 DIAGNOSIS — Z743 Need for continuous supervision: Secondary | ICD-10-CM | POA: Diagnosis not present

## 2023-05-03 DIAGNOSIS — J45909 Unspecified asthma, uncomplicated: Secondary | ICD-10-CM | POA: Diagnosis not present

## 2023-05-03 DIAGNOSIS — N281 Cyst of kidney, acquired: Secondary | ICD-10-CM | POA: Diagnosis not present

## 2023-05-03 DIAGNOSIS — E785 Hyperlipidemia, unspecified: Secondary | ICD-10-CM | POA: Diagnosis not present

## 2023-05-03 DIAGNOSIS — R0689 Other abnormalities of breathing: Secondary | ICD-10-CM | POA: Diagnosis not present

## 2023-05-03 DIAGNOSIS — K567 Ileus, unspecified: Secondary | ICD-10-CM | POA: Diagnosis not present

## 2023-05-03 DIAGNOSIS — K56601 Complete intestinal obstruction, unspecified as to cause: Secondary | ICD-10-CM | POA: Diagnosis not present

## 2023-05-03 DIAGNOSIS — K56609 Unspecified intestinal obstruction, unspecified as to partial versus complete obstruction: Secondary | ICD-10-CM | POA: Diagnosis not present

## 2023-05-03 DIAGNOSIS — Z794 Long term (current) use of insulin: Secondary | ICD-10-CM | POA: Diagnosis not present

## 2023-05-03 DIAGNOSIS — Z7722 Contact with and (suspected) exposure to environmental tobacco smoke (acute) (chronic): Secondary | ICD-10-CM | POA: Diagnosis not present

## 2023-05-03 DIAGNOSIS — N2889 Other specified disorders of kidney and ureter: Secondary | ICD-10-CM | POA: Diagnosis not present

## 2023-05-03 DIAGNOSIS — N289 Disorder of kidney and ureter, unspecified: Secondary | ICD-10-CM | POA: Diagnosis not present

## 2023-05-03 DIAGNOSIS — Z7984 Long term (current) use of oral hypoglycemic drugs: Secondary | ICD-10-CM | POA: Diagnosis not present

## 2023-05-03 DIAGNOSIS — Z79899 Other long term (current) drug therapy: Secondary | ICD-10-CM | POA: Diagnosis not present

## 2023-05-03 DIAGNOSIS — Z8709 Personal history of other diseases of the respiratory system: Secondary | ICD-10-CM | POA: Diagnosis not present

## 2023-05-03 DIAGNOSIS — K5652 Intestinal adhesions [bands] with complete obstruction: Secondary | ICD-10-CM | POA: Diagnosis not present

## 2023-05-03 DIAGNOSIS — R0902 Hypoxemia: Secondary | ICD-10-CM | POA: Diagnosis not present

## 2023-05-03 DIAGNOSIS — N182 Chronic kidney disease, stage 2 (mild): Secondary | ICD-10-CM | POA: Diagnosis not present

## 2023-05-03 DIAGNOSIS — R1010 Upper abdominal pain, unspecified: Secondary | ICD-10-CM | POA: Diagnosis not present

## 2023-05-03 DIAGNOSIS — J9811 Atelectasis: Secondary | ICD-10-CM | POA: Diagnosis not present

## 2023-05-03 DIAGNOSIS — K5900 Constipation, unspecified: Secondary | ICD-10-CM | POA: Diagnosis not present

## 2023-05-17 DIAGNOSIS — J45909 Unspecified asthma, uncomplicated: Secondary | ICD-10-CM | POA: Diagnosis not present

## 2023-05-17 DIAGNOSIS — Z48815 Encounter for surgical aftercare following surgery on the digestive system: Secondary | ICD-10-CM | POA: Diagnosis not present

## 2023-05-17 DIAGNOSIS — Z7984 Long term (current) use of oral hypoglycemic drugs: Secondary | ICD-10-CM | POA: Diagnosis not present

## 2023-05-17 DIAGNOSIS — I10 Essential (primary) hypertension: Secondary | ICD-10-CM | POA: Diagnosis not present

## 2023-05-17 DIAGNOSIS — E119 Type 2 diabetes mellitus without complications: Secondary | ICD-10-CM | POA: Diagnosis not present

## 2023-05-17 DIAGNOSIS — Z794 Long term (current) use of insulin: Secondary | ICD-10-CM | POA: Diagnosis not present

## 2023-05-18 DIAGNOSIS — E119 Type 2 diabetes mellitus without complications: Secondary | ICD-10-CM | POA: Diagnosis not present

## 2023-05-18 DIAGNOSIS — K560 Paralytic ileus: Secondary | ICD-10-CM | POA: Diagnosis not present

## 2023-05-18 DIAGNOSIS — I10 Essential (primary) hypertension: Secondary | ICD-10-CM | POA: Diagnosis not present

## 2023-05-18 DIAGNOSIS — K56601 Complete intestinal obstruction, unspecified as to cause: Secondary | ICD-10-CM | POA: Diagnosis not present

## 2023-05-21 DIAGNOSIS — E119 Type 2 diabetes mellitus without complications: Secondary | ICD-10-CM | POA: Diagnosis not present

## 2023-05-21 DIAGNOSIS — J45909 Unspecified asthma, uncomplicated: Secondary | ICD-10-CM | POA: Diagnosis not present

## 2023-05-21 DIAGNOSIS — I10 Essential (primary) hypertension: Secondary | ICD-10-CM | POA: Diagnosis not present

## 2023-05-21 DIAGNOSIS — Z7984 Long term (current) use of oral hypoglycemic drugs: Secondary | ICD-10-CM | POA: Diagnosis not present

## 2023-05-21 DIAGNOSIS — Z794 Long term (current) use of insulin: Secondary | ICD-10-CM | POA: Diagnosis not present

## 2023-05-21 DIAGNOSIS — Z48815 Encounter for surgical aftercare following surgery on the digestive system: Secondary | ICD-10-CM | POA: Diagnosis not present

## 2023-05-26 DIAGNOSIS — Z794 Long term (current) use of insulin: Secondary | ICD-10-CM | POA: Diagnosis not present

## 2023-05-26 DIAGNOSIS — I10 Essential (primary) hypertension: Secondary | ICD-10-CM | POA: Diagnosis not present

## 2023-05-26 DIAGNOSIS — Z48815 Encounter for surgical aftercare following surgery on the digestive system: Secondary | ICD-10-CM | POA: Diagnosis not present

## 2023-05-26 DIAGNOSIS — Z7984 Long term (current) use of oral hypoglycemic drugs: Secondary | ICD-10-CM | POA: Diagnosis not present

## 2023-05-26 DIAGNOSIS — E119 Type 2 diabetes mellitus without complications: Secondary | ICD-10-CM | POA: Diagnosis not present

## 2023-05-26 DIAGNOSIS — J45909 Unspecified asthma, uncomplicated: Secondary | ICD-10-CM | POA: Diagnosis not present

## 2023-06-07 DIAGNOSIS — Z794 Long term (current) use of insulin: Secondary | ICD-10-CM | POA: Diagnosis not present

## 2023-06-07 DIAGNOSIS — Z7984 Long term (current) use of oral hypoglycemic drugs: Secondary | ICD-10-CM | POA: Diagnosis not present

## 2023-06-07 DIAGNOSIS — Z48815 Encounter for surgical aftercare following surgery on the digestive system: Secondary | ICD-10-CM | POA: Diagnosis not present

## 2023-06-07 DIAGNOSIS — E119 Type 2 diabetes mellitus without complications: Secondary | ICD-10-CM | POA: Diagnosis not present

## 2023-06-07 DIAGNOSIS — I10 Essential (primary) hypertension: Secondary | ICD-10-CM | POA: Diagnosis not present

## 2023-06-07 DIAGNOSIS — J45909 Unspecified asthma, uncomplicated: Secondary | ICD-10-CM | POA: Diagnosis not present

## 2023-06-17 DIAGNOSIS — E1165 Type 2 diabetes mellitus with hyperglycemia: Secondary | ICD-10-CM | POA: Diagnosis not present

## 2023-06-17 DIAGNOSIS — I10 Essential (primary) hypertension: Secondary | ICD-10-CM | POA: Diagnosis not present

## 2023-07-07 DIAGNOSIS — R600 Localized edema: Secondary | ICD-10-CM | POA: Diagnosis not present

## 2023-07-07 DIAGNOSIS — E119 Type 2 diabetes mellitus without complications: Secondary | ICD-10-CM | POA: Diagnosis not present

## 2023-07-07 DIAGNOSIS — I10 Essential (primary) hypertension: Secondary | ICD-10-CM | POA: Diagnosis not present

## 2023-08-07 DIAGNOSIS — E1165 Type 2 diabetes mellitus with hyperglycemia: Secondary | ICD-10-CM | POA: Diagnosis not present

## 2023-08-07 DIAGNOSIS — I10 Essential (primary) hypertension: Secondary | ICD-10-CM | POA: Diagnosis not present

## 2023-09-04 DIAGNOSIS — I10 Essential (primary) hypertension: Secondary | ICD-10-CM | POA: Diagnosis not present

## 2023-09-04 DIAGNOSIS — E1165 Type 2 diabetes mellitus with hyperglycemia: Secondary | ICD-10-CM | POA: Diagnosis not present

## 2023-09-10 DIAGNOSIS — E119 Type 2 diabetes mellitus without complications: Secondary | ICD-10-CM | POA: Diagnosis not present

## 2023-09-10 DIAGNOSIS — M542 Cervicalgia: Secondary | ICD-10-CM | POA: Diagnosis not present

## 2023-09-10 DIAGNOSIS — S02612A Fracture of condylar process of left mandible, initial encounter for closed fracture: Secondary | ICD-10-CM | POA: Diagnosis not present

## 2023-09-10 DIAGNOSIS — S02611A Fracture of condylar process of right mandible, initial encounter for closed fracture: Secondary | ICD-10-CM | POA: Diagnosis not present

## 2023-09-10 DIAGNOSIS — E785 Hyperlipidemia, unspecified: Secondary | ICD-10-CM | POA: Diagnosis not present

## 2023-09-10 DIAGNOSIS — M47812 Spondylosis without myelopathy or radiculopathy, cervical region: Secondary | ICD-10-CM | POA: Diagnosis not present

## 2023-09-10 DIAGNOSIS — Z7984 Long term (current) use of oral hypoglycemic drugs: Secondary | ICD-10-CM | POA: Diagnosis not present

## 2023-09-10 DIAGNOSIS — Z79899 Other long term (current) drug therapy: Secondary | ICD-10-CM | POA: Diagnosis not present

## 2023-09-10 DIAGNOSIS — Z794 Long term (current) use of insulin: Secondary | ICD-10-CM | POA: Diagnosis not present

## 2023-09-10 DIAGNOSIS — Z87891 Personal history of nicotine dependence: Secondary | ICD-10-CM | POA: Diagnosis not present

## 2023-09-10 DIAGNOSIS — S0990XA Unspecified injury of head, initial encounter: Secondary | ICD-10-CM | POA: Diagnosis not present

## 2023-09-10 DIAGNOSIS — S0302XA Dislocation of jaw, left side, initial encounter: Secondary | ICD-10-CM | POA: Diagnosis not present

## 2023-09-10 DIAGNOSIS — H538 Other visual disturbances: Secondary | ICD-10-CM | POA: Diagnosis not present

## 2023-09-10 DIAGNOSIS — M4802 Spinal stenosis, cervical region: Secondary | ICD-10-CM | POA: Diagnosis not present

## 2023-09-10 DIAGNOSIS — I10 Essential (primary) hypertension: Secondary | ICD-10-CM | POA: Diagnosis not present

## 2023-09-20 DIAGNOSIS — I10 Essential (primary) hypertension: Secondary | ICD-10-CM | POA: Diagnosis not present

## 2023-09-20 DIAGNOSIS — E119 Type 2 diabetes mellitus without complications: Secondary | ICD-10-CM | POA: Diagnosis not present

## 2023-09-20 DIAGNOSIS — M542 Cervicalgia: Secondary | ICD-10-CM | POA: Diagnosis not present

## 2023-09-20 DIAGNOSIS — E1165 Type 2 diabetes mellitus with hyperglycemia: Secondary | ICD-10-CM | POA: Diagnosis not present

## 2023-09-20 DIAGNOSIS — K560 Paralytic ileus: Secondary | ICD-10-CM | POA: Diagnosis not present

## 2023-09-20 DIAGNOSIS — R600 Localized edema: Secondary | ICD-10-CM | POA: Diagnosis not present

## 2023-10-19 DIAGNOSIS — S0300XD Dislocation of jaw, unspecified side, subsequent encounter: Secondary | ICD-10-CM | POA: Diagnosis not present

## 2023-10-22 DIAGNOSIS — I10 Essential (primary) hypertension: Secondary | ICD-10-CM | POA: Diagnosis not present

## 2023-10-22 DIAGNOSIS — E1165 Type 2 diabetes mellitus with hyperglycemia: Secondary | ICD-10-CM | POA: Diagnosis not present

## 2023-10-28 DIAGNOSIS — S0302XD Dislocation of jaw, left side, subsequent encounter: Secondary | ICD-10-CM | POA: Diagnosis not present

## 2023-11-11 DIAGNOSIS — E1169 Type 2 diabetes mellitus with other specified complication: Secondary | ICD-10-CM | POA: Diagnosis not present

## 2023-11-11 DIAGNOSIS — I10 Essential (primary) hypertension: Secondary | ICD-10-CM | POA: Diagnosis not present

## 2023-11-11 DIAGNOSIS — Z1389 Encounter for screening for other disorder: Secondary | ICD-10-CM | POA: Diagnosis not present

## 2023-11-11 DIAGNOSIS — J42 Unspecified chronic bronchitis: Secondary | ICD-10-CM | POA: Diagnosis not present

## 2023-11-11 DIAGNOSIS — E785 Hyperlipidemia, unspecified: Secondary | ICD-10-CM | POA: Diagnosis not present

## 2023-11-11 DIAGNOSIS — M1712 Unilateral primary osteoarthritis, left knee: Secondary | ICD-10-CM | POA: Diagnosis not present

## 2023-11-11 DIAGNOSIS — Z96652 Presence of left artificial knee joint: Secondary | ICD-10-CM | POA: Diagnosis not present

## 2023-11-11 DIAGNOSIS — M542 Cervicalgia: Secondary | ICD-10-CM | POA: Diagnosis not present

## 2023-11-11 DIAGNOSIS — Z0001 Encounter for general adult medical examination with abnormal findings: Secondary | ICD-10-CM | POA: Diagnosis not present

## 2023-11-25 ENCOUNTER — Ambulatory Visit: Attending: Otolaryngology

## 2023-11-25 DIAGNOSIS — M542 Cervicalgia: Secondary | ICD-10-CM | POA: Diagnosis not present
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
## 2023-11-29 ENCOUNTER — Ambulatory Visit: Attending: Otolaryngology | Admitting: Physical Therapy

## 2023-11-29 DIAGNOSIS — M62838 Other muscle spasm: Secondary | ICD-10-CM | POA: Diagnosis not present

## 2023-11-29 DIAGNOSIS — M542 Cervicalgia: Secondary | ICD-10-CM | POA: Diagnosis not present

## 2023-11-29 NOTE — Therapy (Signed)
 OUTPATIENT PHYSICAL THERAPY CERVICAL TREATMENT   Patient Name: Donald Mclaughlin MRN: [id number] DOB:1945-08-20, 78 y.o., male Today's Date: 11/29/2023  END OF SESSION:  PT End of Session - 11/29/23 1626     Visit Number 2    Number of Visits 8    Date for PT Re-Evaluation 01/21/24    PT Start Time 0351    PT Stop Time 0444    PT Time Calculation (min) 53 min    Activity Tolerance Patient tolerated treatment well    Behavior During Therapy [facility] for tasks assessed/performed             Past Medical History:  Diagnosis Date   Arthritis    Diabetes mellitus without complication (HCC)    High blood pressure    Thrombocytopenia (HCC) 12/31/2015   Past Surgical History:  Procedure Laterality Date   HIP SURGERY Right 2013   arthroplasty   TOTAL KNEE ARTHROPLASTY Left 12/12/2019   Procedure: TOTAL KNEE ARTHROPLASTY/ Left;  Surgeon: Darrin Emerald, MD;  Location: [facility];  Service: Orthopedics;  Laterality: Left;   Patient Active Problem List   Diagnosis Date Noted   S/P TKR (total knee replacement), left 12/12/19 02/05/2020   Primary osteoarthritis of left knee    Thrombocytopenia (HCC) 12/31/2015   Onychomycosis 05/08/2013   Pain, foot 05/08/2013   REFERRING PROVIDER: Everardo Hitch, MD  REFERRING DIAG: Cervicalgia  THERAPY DIAG:  Cervicalgia  Other muscle spasm  Rationale for Evaluation and Treatment: Rehabilitation  ONSET DATE: 09/10/23  SUBJECTIVE:                                                                                                                                                                                                         SUBJECTIVE STATEMENT: No new complaints .  PERTINENT HISTORY:  Osteoarthritis, hypertension, and diabetes  PAIN:  Are you having pain? 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 No  LIVING ENVIRONMENT: Lives with: lives alone Lives in: House/apartment  OCCUPATION: retired  PLOF: Independent  PATIENT GOALS: reduced pain  NEXT MD VISIT: none scheduled  OBJECTIVE:  Note: Objective measures were completed at Evaluation unless otherwise noted.  PATIENT SURVEYS:  NDI 22% disability  COGNITION: Overall cognitive status: Within functional limits  for tasks assessed  SENSATION: Patient reports intermittent numbness along left upper trapezius.   PALPATION: TTP: left upper trapezius and cervical paraspinals  CERVICAL JOINT MOBILITY:  C2-4: WFL and nonpainful   C4-6: hypomobile and painful   C6-T1: WFL and nonpainful   CERVICAL ROM:   Active ROM A/PROM (deg) eval  Flexion 36  Extension 24; familiar pain  Right lateral flexion 20; familiar pain  Left lateral flexion 30  Right rotation 62; familiar pain  Left rotation 45   (Blank rows = not tested)  UPPER EXTREMITY ROM: WFL for activities assessed  UPPER EXTREMITY MMT:  MMT Right eval Left eval  Shoulder flexion 4-/5 4-/5; familiar pain  Shoulder extension    Shoulder abduction 4/5 4/5; familiar pain  Shoulder adduction    Shoulder extension    Shoulder internal rotation    Shoulder external rotation    Middle trapezius    Lower trapezius    Elbow flexion 4+/5 4/5; familiar pain  Elbow extension 4-/5 4-/5; familiar pain  Wrist flexion    Wrist extension    Wrist ulnar deviation    Wrist radial deviation    Wrist pronation    Wrist supination    Grip strength 47 30   (Blank rows = not tested)  CERVICAL SPECIAL TESTS:  Distraction test: negative, familiar pain and Sharp pursor's test: Negative  TREATMENT DATE:                                                                                                                                 11/29/23:  Small soundhead combo e'stim/US  at 1.50 W/CM2 x 12 minutes to patient's left cervical paraspinal musculature and UT f/b STW/M with ischemic release technique utilized f/b HMP and IFC at 80-150 Hz on 40% scan x 20 minutes.    PATIENT EDUCATION:  Education details: Plan of care, prognosis, healing, objective findings, and goals for physical therapy Person educated: Patient Education method: Explanation Education comprehension: verbalized understanding  HOME EXERCISE PROGRAM:   ASSESSMENT:  CLINICAL IMPRESSION: Patient had a notable trigger point in his left mid-cervical paraspinal musculature with good response to STW/M.  He felt better after treatment.    OBJECTIVE IMPAIRMENTS: decreased activity tolerance, decreased ROM, decreased strength, hypomobility, impaired sensation, impaired tone, and pain.   ACTIVITY LIMITATIONS: sleeping  PARTICIPATION LIMITATIONS: community activity  PERSONAL FACTORS: Time since onset of injury/illness/exacerbation, Transportation, and 3+ comorbidities: Osteoarthritis, hypertension, and diabetes are also affecting patient's functional outcome.   REHAB POTENTIAL: Good  CLINICAL DECISION MAKING: Stable/uncomplicated  EVALUATION COMPLEXITY: Low   GOALS: Goals reviewed with patient? Yes  LONG TERM GOALS: Target date: 12/23/23  Patient will be independent with his HEP. Baseline:  Goal status: INITIAL  2.  Patient will be able to demonstrate at least 60 degrees of left cervical rotation for improved awareness of his surroundings. Baseline:  Goal status: INITIAL  3.  Patient will report being able to sleep without  being awakened by his familiar cervical symptoms. Baseline:  Goal status: INITIAL  4.  Patient will be able to complete his daily activities without his familiar symptoms exceeding 4/10. Baseline:  Goal status: INITIAL  5.  Patient will improve his NDI score to 12% or  less for improved perceived function with his daily activities. Baseline:  Goal status: INITIAL  PLAN:  PT FREQUENCY: 2x/week  PT DURATION: 4 weeks  PLANNED INTERVENTIONS: 97164- PT Re-evaluation, 97750- Physical Performance Testing, 97110-Therapeutic exercises, 97530- Therapeutic activity, [id number]- Neuromuscular re-education, 97535- Self Care, 16109- Manual therapy, G0283- Electrical stimulation (unattended), Patient/Family education, Dry Needling, Joint mobilization, Spinal mobilization, Cryotherapy, and Moist heat  PLAN FOR NEXT SESSION: UBE, manual therapy, chin tucks, and modalities as needed   Aprill Banko, [name], PT 11/29/2023, 4:50 PM

## 2023-12-01 ENCOUNTER — Ambulatory Visit

## 2023-12-01 DIAGNOSIS — M542 Cervicalgia: Secondary | ICD-10-CM | POA: Diagnosis not present

## 2023-12-01 DIAGNOSIS — M62838 Other muscle spasm: Secondary | ICD-10-CM | POA: Diagnosis not present

## 2023-12-01 NOTE — Therapy (Signed)
 OUTPATIENT PHYSICAL THERAPY CERVICAL TREATMENT   Patient Name: Donald Mclaughlin MRN: [id number] DOB:1946-03-23, 78 y.o., male Today's Date: 12/01/2023  END OF SESSION:  PT End of Session - 12/01/23 1558     Visit Number 3    Number of Visits 8    Date for PT Re-Evaluation 01/21/24    PT Start Time 1559    PT Stop Time 1645    PT Time Calculation (min) 46 min    Activity Tolerance Patient tolerated treatment well    Behavior During Therapy [facility] for tasks assessed/performed              Past Medical History:  Diagnosis Date   Arthritis    Diabetes mellitus without complication (HCC)    High blood pressure    Thrombocytopenia (HCC) 12/31/2015   Past Surgical History:  Procedure Laterality Date   HIP SURGERY Right 2013   arthroplasty   TOTAL KNEE ARTHROPLASTY Left 12/12/2019   Procedure: TOTAL KNEE ARTHROPLASTY/ Left;  Surgeon: Darrin Emerald, MD;  Location: [facility];  Service: Orthopedics;  Laterality: Left;   Patient Active Problem List   Diagnosis Date Noted   S/P TKR (total knee replacement), left 12/12/19 02/05/2020   Primary osteoarthritis of left knee    Thrombocytopenia (HCC) 12/31/2015   Onychomycosis 05/08/2013   Pain, foot 05/08/2013   REFERRING PROVIDER: Everardo Hitch, MD  REFERRING DIAG: Cervicalgia  THERAPY DIAG:  Cervicalgia  Rationale for Evaluation and Treatment: Rehabilitation  ONSET DATE: 09/10/23  SUBJECTIVE:                                                                                                                                                                                                         SUBJECTIVE STATEMENT: Patient reports that he is hurting very little right now. He did sleep a little better after his last appointment.  Aaron Aas  PERTINENT HISTORY:  Osteoarthritis, hypertension, and diabetes  PAIN:  Are you having pain? 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 No  LIVING ENVIRONMENT: Lives with: lives alone Lives in: House/apartment  OCCUPATION: retired  PLOF: Independent  PATIENT GOALS: reduced pain  NEXT MD VISIT: none scheduled  OBJECTIVE:  Note: Objective measures were completed at Evaluation unless otherwise noted.  PATIENT SURVEYS:  NDI 22% disability  COGNITION: Overall cognitive status: Within functional limits for tasks assessed  SENSATION: Patient reports intermittent numbness along left upper trapezius.   PALPATION: TTP: left upper trapezius and cervical paraspinals  CERVICAL JOINT MOBILITY:  C2-4: WFL and nonpainful   C4-6: hypomobile and painful   C6-T1: WFL and nonpainful   CERVICAL ROM:   Active ROM A/PROM (deg) eval  Flexion 36  Extension 24; familiar pain  Right lateral flexion 20; familiar pain  Left lateral flexion 30  Right rotation 62; familiar pain  Left rotation 45   (Blank rows = not tested)  UPPER EXTREMITY ROM: WFL for activities assessed  UPPER EXTREMITY MMT:  MMT Right eval Left eval  Shoulder flexion 4-/5 4-/5; familiar pain  Shoulder extension    Shoulder abduction 4/5 4/5; familiar pain  Shoulder adduction    Shoulder extension    Shoulder internal rotation    Shoulder external rotation    Middle trapezius    Lower trapezius    Elbow flexion 4+/5 4/5; familiar pain  Elbow extension 4-/5 4-/5; familiar pain  Wrist flexion    Wrist extension    Wrist ulnar deviation    Wrist radial deviation    Wrist pronation    Wrist supination    Grip strength 47 30   (Blank rows = not tested)  CERVICAL SPECIAL TESTS:  Distraction test: negative, familiar pain and Sharp pursor's test: Negative  TREATMENT  DATE:                                                                                                                                                                 12/01/23 EXERCISE LOG  Exercise Repetitions and Resistance Comments  UBE  8 minutes @ 120 RPM    Resisted row Green t-band x 3.5 minutes   Therabar bending Red t-band x 2.5 minutes each  Up and down; recommend attempting green t-bar at next appointment, as able  Manual therapy See below        Blank cell = exercise not performed today  Manual Therapy Soft Tissue Mobilization: left upper trapezius, levator scapulae, and scapular stabilizers, for reduced pain and tone  Modalities: no redness or adverse reaction to today's modalities  Date:  Unattended Estim: left upper trapezius, pre mod @ 80-150 Hz, 13 mins, Pain and Tone Hot Pack: Cervical, 13 mins, Pain and Tone  11/29/23:  Small soundhead combo e'stim/US  at 1.50 W/CM2 x 12 minutes to patient's left cervical paraspinal musculature and UT f/b STW/M with ischemic release technique utilized f/b HMP and IFC at 80-150 Hz on 40% scan x 20 minutes.    PATIENT EDUCATION:  Education details: anatomy Person educated: Patient Education method: Explanation Education comprehension: verbalized understanding  HOME EXERCISE PROGRAM:  ASSESSMENT:  CLINICAL IMPRESSION: Patient was introduced to multiple new interventions for postural reeducation. He required moderate multimodal cueing with resisted rows for facilitate periscapular engagement. Manual therapy was able to significantly reduce his familiar symptoms with ischemic release to his left upper trapezius being the most effective. He reported that his neck felt better as he was not hurting any upon the conclusion of treatment. He continues to require skilled physical therapy to address his remaining impairments to return to his prior level of function.   OBJECTIVE IMPAIRMENTS: decreased activity tolerance, decreased ROM, decreased  strength, hypomobility, impaired sensation, impaired tone, and pain.   ACTIVITY LIMITATIONS: sleeping  PARTICIPATION LIMITATIONS: community activity  PERSONAL FACTORS: Time since onset of injury/illness/exacerbation, Transportation, and 3+ comorbidities: Osteoarthritis, hypertension, and diabetes are also affecting patient's functional outcome.   REHAB POTENTIAL: Good  CLINICAL DECISION MAKING: Stable/uncomplicated  EVALUATION COMPLEXITY: Low   GOALS: Goals reviewed with patient? Yes  LONG TERM GOALS: Target date: 12/23/23  Patient will be independent with his HEP. Baseline:  Goal status: INITIAL  2.  Patient will be able to demonstrate at least 60 degrees of left cervical rotation for improved awareness of his surroundings. Baseline:  Goal status: INITIAL  3.  Patient will report being able to sleep without being awakened by his familiar cervical symptoms. Baseline:  Goal status: INITIAL  4.  Patient will be able to complete his daily activities without his familiar symptoms exceeding 4/10. Baseline:  Goal status: INITIAL  5.  Patient will improve his NDI score to 12% or less for improved perceived function with his daily activities. Baseline:  Goal status: INITIAL  PLAN:  PT FREQUENCY: 2x/week  PT DURATION: 4 weeks  PLANNED INTERVENTIONS: 16109- PT Re-evaluation, 97750- Physical Performance Testing, 97110-Therapeutic exercises, 97530- Therapeutic activity, [id number]- Neuromuscular re-education, 97535- Self Care, 60454- Manual therapy, G0283- Electrical stimulation (unattended), Patient/Family education, Dry Needling, Joint mobilization, Spinal mobilization, Cryotherapy, and Moist heat  PLAN FOR NEXT SESSION: UBE, manual therapy, chin tucks, and modalities as needed   Lane Pinon, PT 12/01/2023, 4:48 PM

## 2023-12-06 ENCOUNTER — Ambulatory Visit: Admitting: Physical Therapy

## 2023-12-06 ENCOUNTER — Encounter: Payer: Self-pay | Admitting: Physical Therapy

## 2023-12-06 DIAGNOSIS — M542 Cervicalgia: Secondary | ICD-10-CM | POA: Diagnosis not present

## 2023-12-06 DIAGNOSIS — M62838 Other muscle spasm: Secondary | ICD-10-CM

## 2023-12-06 NOTE — Therapy (Signed)
 OUTPATIENT PHYSICAL THERAPY CERVICAL TREATMENT   Patient Name: Donald Mclaughlin MRN: [id number] DOB:07-09-1945, 78 y.o., male Today's Date: 12/06/2023  END OF SESSION:  PT End of Session - 12/06/23 1656     Visit Number 4    Number of Visits 8    Date for PT Re-Evaluation 01/21/24    PT Start Time 0400    PT Stop Time 0453    PT Time Calculation (min) 53 min    Activity Tolerance Patient tolerated treatment well    Behavior During Therapy [facility] for tasks assessed/performed              Past Medical History:  Diagnosis Date   Arthritis    Diabetes mellitus without complication (HCC)    High blood pressure    Thrombocytopenia (HCC) 12/31/2015   Past Surgical History:  Procedure Laterality Date   HIP SURGERY Right 2013   arthroplasty   TOTAL KNEE ARTHROPLASTY Left 12/12/2019   Procedure: TOTAL KNEE ARTHROPLASTY/ Left;  Surgeon: Darrin Emerald, MD;  Location: [facility];  Service: Orthopedics;  Laterality: Left;   Patient Active Problem List   Diagnosis Date Noted   S/P TKR (total knee replacement), left 12/12/19 02/05/2020   Primary osteoarthritis of left knee    Thrombocytopenia (HCC) 12/31/2015   Onychomycosis 05/08/2013   Pain, foot 05/08/2013   REFERRING PROVIDER: Everardo Hitch, MD  REFERRING DIAG: Cervicalgia  THERAPY DIAG:  Cervicalgia  Other muscle spasm  Rationale for Evaluation and Treatment: Rehabilitation  ONSET DATE: 09/10/23  SUBJECTIVE:                                                                                                                                                                                                         SUBJECTIVE STATEMENT: Pain about a 5. PERTINENT HISTORY:  Osteoarthritis, hypertension, and diabetes  PAIN:  Are you having pain? 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 No  LIVING ENVIRONMENT: Lives with: lives alone Lives in: House/apartment  OCCUPATION: retired  PLOF: Independent  PATIENT GOALS: reduced pain  NEXT MD VISIT: none scheduled  OBJECTIVE:  Note: Objective measures were completed at Evaluation unless otherwise noted.  PATIENT SURVEYS:  NDI 22% disability  COGNITION: Overall cognitive status: Within functional limits  for tasks assessed  SENSATION: Patient reports intermittent numbness along left upper trapezius.   PALPATION: TTP: left upper trapezius and cervical paraspinals  CERVICAL JOINT MOBILITY:  C2-4: WFL and nonpainful   C4-6: hypomobile and painful   C6-T1: WFL and nonpainful   CERVICAL ROM:   Active ROM A/PROM (deg) eval  Flexion 36  Extension 24; familiar pain  Right lateral flexion 20; familiar pain  Left lateral flexion 30  Right rotation 62; familiar pain  Left rotation 45   (Blank rows = not tested)  UPPER EXTREMITY ROM: WFL for activities assessed  UPPER EXTREMITY MMT:  MMT Right eval Left eval  Shoulder flexion 4-/5 4-/5; familiar pain  Shoulder extension    Shoulder abduction 4/5 4/5; familiar pain  Shoulder adduction    Shoulder extension    Shoulder internal rotation    Shoulder external rotation    Middle trapezius    Lower trapezius    Elbow flexion 4+/5 4/5; familiar pain  Elbow extension 4-/5 4-/5; familiar pain  Wrist flexion    Wrist extension    Wrist ulnar deviation    Wrist radial deviation    Wrist pronation    Wrist supination    Grip strength 47 30   (Blank rows = not tested)  CERVICAL SPECIAL TESTS:  Distraction test: negative, familiar pain and Sharp pursor's test: Negative  TREATMENT DATE:      12/06/23:      Small soundhead combo e'stim/US  at 1.50 W/CM2 x 12  minutes to patient's left cervical paraspinal musculature and UT f/b STW/M x 12 minutes with ischemic release technique utilized f/b HMP and IFC at 80-150 Hz on 40% scan x 20 minutes.                                                                                                                                                            12/01/23 EXERCISE LOG  Exercise Repetitions and Resistance Comments  UBE  8 minutes @ 120 RPM    Resisted row Green t-band x 3.5 minutes   Therabar bending Red t-band x 2.5 minutes each  Up and down; recommend attempting green t-bar at next appointment, as able  Manual therapy See below        Blank cell = exercise not performed today  Manual Therapy Soft Tissue Mobilization: left upper trapezius, levator scapulae, and scapular stabilizers, for reduced pain and tone  Modalities: no redness or adverse reaction to today's modalities  Date:  Unattended Estim: left upper trapezius, pre mod @ 80-150 Hz, 13 mins, Pain and Tone Hot Pack: Cervical, 13 mins, Pain and Tone  11/29/23:  Small soundhead combo e'stim/US  at 1.50 W/CM2 x 12 minutes to patient's left cervical paraspinal musculature and UT f/b STW/M with ischemic release technique utilized f/b HMP and  IFC at 80-150 Hz on 40% scan x 20 minutes.    PATIENT EDUCATION:  Education details: anatomy Person educated: Patient Education method: Explanation Education comprehension: verbalized understanding  HOME EXERCISE PROGRAM:   ASSESSMENT:  CLINICAL IMPRESSION: Patient pleased with progress thus far.  Left levator Scap TP noted today with very good response to STW/M.    OBJECTIVE IMPAIRMENTS: decreased activity tolerance, decreased ROM, decreased strength, hypomobility, impaired sensation, impaired tone, and pain.   ACTIVITY LIMITATIONS: sleeping  PARTICIPATION LIMITATIONS: community activity  PERSONAL FACTORS: Time since onset of injury/illness/exacerbation, Transportation, and 3+ comorbidities:  Osteoarthritis, hypertension, and diabetes are also affecting patient's functional outcome.   REHAB POTENTIAL: Good  CLINICAL DECISION MAKING: Stable/uncomplicated  EVALUATION COMPLEXITY: Low   GOALS: Goals reviewed with patient? Yes  LONG TERM GOALS: Target date: 12/23/23  Patient will be independent with his HEP. Baseline:  Goal status: INITIAL  2.  Patient will be able to demonstrate at least 60 degrees of left cervical rotation for improved awareness of his surroundings. Baseline:  Goal status: INITIAL  3.  Patient will report being able to sleep without being awakened by his familiar cervical symptoms. Baseline:  Goal status: INITIAL  4.  Patient will be able to complete his daily activities without his familiar symptoms exceeding 4/10. Baseline:  Goal status: INITIAL  5.  Patient will improve his NDI score to 12% or less for improved perceived function with his daily activities. Baseline:  Goal status: INITIAL  PLAN:  PT FREQUENCY: 2x/week  PT DURATION: 4 weeks  PLANNED INTERVENTIONS: 97164- PT Re-evaluation, 97750- Physical Performance Testing, 97110-Therapeutic exercises, 97530- Therapeutic activity, [id number]- Neuromuscular re-education, 97535- Self Care, 10960- Manual therapy, G0283- Electrical stimulation (unattended), Patient/Family education, Dry Needling, Joint mobilization, Spinal mobilization, Cryotherapy, and Moist heat  PLAN FOR NEXT SESSION: UBE, manual therapy, chin tucks, and modalities as needed   Armanda Forand, [name], PT 12/06/2023, 5:00 PM

## 2023-12-08 ENCOUNTER — Ambulatory Visit

## 2023-12-08 DIAGNOSIS — M542 Cervicalgia: Secondary | ICD-10-CM | POA: Diagnosis not present

## 2023-12-08 DIAGNOSIS — M62838 Other muscle spasm: Secondary | ICD-10-CM | POA: Diagnosis not present

## 2023-12-08 NOTE — Therapy (Signed)
 OUTPATIENT PHYSICAL THERAPY CERVICAL TREATMENT   Patient Name: Donald Mclaughlin MRN: [id number] DOB:[birth year], 78 y.o., male Today's Date: 12/08/2023  END OF SESSION:  PT End of Session - 12/08/23 1604     Visit Number 5    Number of Visits 8    Date for PT Re-Evaluation 01/21/24    PT Start Time 1600    PT Stop Time 1650    PT Time Calculation (min) 50 min    Activity Tolerance Patient tolerated treatment well    Behavior During Therapy [facility] for tasks assessed/performed              Past Medical History:  Diagnosis Date   Arthritis    Diabetes mellitus without complication (HCC)    High blood pressure    Thrombocytopenia (HCC) 12/31/2015   Past Surgical History:  Procedure Laterality Date   HIP SURGERY Right 2013   arthroplasty   TOTAL KNEE ARTHROPLASTY Left 12/12/2019   Procedure: TOTAL KNEE ARTHROPLASTY/ Left;  Surgeon: Darrin Emerald, MD;  Location: [facility];  Service: Orthopedics;  Laterality: Left;   Patient Active Problem List   Diagnosis Date Noted   S/P TKR (total knee replacement), left 12/12/19 02/05/2020   Primary osteoarthritis of left knee    Thrombocytopenia (HCC) 12/31/2015   Onychomycosis 05/08/2013   Pain, foot 05/08/2013   REFERRING PROVIDER: Everardo Hitch, MD  REFERRING DIAG: Cervicalgia  THERAPY DIAG:  Cervicalgia  Other muscle spasm  Rationale for Evaluation and Treatment: Rehabilitation  ONSET DATE: 09/10/23  SUBJECTIVE:                                                                                                                                                                                                         SUBJECTIVE STATEMENT: Pt reports 4/10 left neck pain.   PERTINENT HISTORY:  Osteoarthritis, hypertension, and diabetes  PAIN:  Are you having pain? 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 No  LIVING ENVIRONMENT: Lives with: lives alone Lives in: House/apartment  OCCUPATION: retired  PLOF: Independent  PATIENT GOALS: reduced pain  NEXT MD VISIT: none scheduled  OBJECTIVE:  Note: Objective measures were completed at Evaluation unless otherwise noted.  PATIENT SURVEYS:  NDI 22% disability  COGNITION: Overall cognitive status: Within functional limits  for tasks assessed  SENSATION: Patient reports intermittent numbness along left upper trapezius.   PALPATION: TTP: left upper trapezius and cervical paraspinals  CERVICAL JOINT MOBILITY:  C2-4: WFL and nonpainful   C4-6: hypomobile and painful   C6-T1: WFL and nonpainful   CERVICAL ROM:   Active ROM A/PROM (deg) eval  Flexion 36  Extension 24; familiar pain  Right lateral flexion 20; familiar pain  Left lateral flexion 30  Right rotation 62; familiar pain  Left rotation 45   (Blank rows = not tested)  UPPER EXTREMITY ROM: WFL for activities assessed  UPPER EXTREMITY MMT:  MMT Right eval Left eval  Shoulder flexion 4-/5 4-/5; familiar pain  Shoulder extension    Shoulder abduction 4/5 4/5; familiar pain  Shoulder adduction    Shoulder extension    Shoulder internal rotation    Shoulder external rotation    Middle trapezius    Lower trapezius    Elbow flexion 4+/5 4/5; familiar pain  Elbow extension 4-/5 4-/5; familiar pain  Wrist flexion    Wrist extension    Wrist ulnar deviation    Wrist radial deviation    Wrist pronation    Wrist supination    Grip strength 47 30   (Blank rows = not tested)  CERVICAL SPECIAL TESTS:  Distraction test: negative, familiar pain and Sharp pursor's test: Negative  TREATMENT DATE:      12/08/23   EXERCISE LOG  Exercise Repetitions and Resistance Comments   UBE  8 minutes @ 120 RPM    Resisted row Green t-band x 3.5 minutes   Therabar bending Red t-band x 2.5 minutes each  Up and down; recommend attempting green t-bar at next appointment, as able  Manual therapy See below        Blank cell = exercise not performed today  Manual Therapy Soft Tissue Mobilization: left upper trapezius, levator scapulae, and scapular stabilizers, for reduced pain and tone   Modalities: no redness or adverse reaction to today's modalities  Date:  Unattended Estim: left upper trapezius, pre mod @ 80-150 Hz, 13 mins, Pain and Tone  12/06/23:      Small soundhead combo e'stim/US  at 1.50 W/CM2 x 12 minutes to patient's left cervical paraspinal musculature and UT f/b STW/M x 12 minutes with ischemic release technique utilized f/b HMP and IFC at 80-150 Hz on 40% scan x 20 minutes.                                                                                                                           12/01/23    EXERCISE LOG  Exercise Repetitions and Resistance Comments  UBE  8 minutes @ 120 RPM    Resisted row Green t-band x 3.5 minutes   Therabar bending Red t-band x 2.5 minutes each  Up and down; recommend attempting green t-bar at next appointment, as able  Manual therapy See below        Blank cell =  exercise not performed today  Manual Therapy Soft Tissue Mobilization: left upper trapezius, levator scapulae, and scapular stabilizers, for reduced pain and tone  Modalities: no redness or adverse reaction to today's modalities  Date:  Unattended Estim: left upper trapezius, pre mod @ 80-150 Hz, 13 mins, Pain and Tone Hot Pack: Cervical, 13 mins, Pain and Tone  PATIENT EDUCATION:  Education details: anatomy Person educated: Patient Education method: Explanation Education comprehension: verbalized understanding  HOME EXERCISE PROGRAM:   ASSESSMENT:  CLINICAL IMPRESSION: Pt arrives for today's treatment session reporting 4/10 left neck pain.  Pt able  to tolerate increased time with previously performed exercises.  Pt reports that his neck has been feeling better since beginning physical therapy.   Pt able to demonstrate 66 degrees of left cervical rotation, meeting his LTG.  Pt also reports that he is only waking up once per night due to his neck pain since beginning therapy.  Pt states that he has continued neck pain of 5-6/10 with his ADLs, largely depending on the activity level.  STW/M performed to left upper trap to decrease pain and tone.  Normal responses to estim noted upon removal.  Pt reported 2/10 left neck pain at completion of today's treatment session.   OBJECTIVE IMPAIRMENTS: decreased activity tolerance, decreased ROM, decreased strength, hypomobility, impaired sensation, impaired tone, and pain.   ACTIVITY LIMITATIONS: sleeping  PARTICIPATION LIMITATIONS: community activity  PERSONAL FACTORS: Time since onset of injury/illness/exacerbation, Transportation, and 3+ comorbidities: Osteoarthritis, hypertension, and diabetes are also affecting patient's functional outcome.   REHAB POTENTIAL: Good  CLINICAL DECISION MAKING: Stable/uncomplicated  EVALUATION COMPLEXITY: Low   GOALS: Goals reviewed with patient? Yes  LONG TERM GOALS: Target date: 12/23/23  Patient will be independent with his HEP. Baseline:  Goal status: IN PROGRESS  2.  Patient will be able to demonstrate at least 60 degrees of left cervical rotation for improved awareness of his surroundings. Baseline: 6/11: 66 degrees Goal status: MET  3.  Patient will report being able to sleep without being awakened by his familiar cervical symptoms. Baseline: 6/11: once per night Goal status: IN PROGRESS  4.  Patient will be able to complete his daily activities without his familiar symptoms exceeding 4/10. Baseline: 6/11: 5-6/10 depending on activity Goal status: IN PROGRESS  5.  Patient will improve his NDI score to 12% or less for improved perceived function  with his daily activities. Baseline:  Goal status: INITIAL  PLAN:  PT FREQUENCY: 2x/week  PT DURATION: 4 weeks  PLANNED INTERVENTIONS: 16109- PT Re-evaluation, 97750- Physical Performance Testing, 97110-Therapeutic exercises, 97530- Therapeutic activity, 97112- Neuromuscular re-education, 97535- Self Care, 60454- Manual therapy, G0283- Electrical stimulation (unattended), Patient/Family education, Dry Needling, Joint mobilization, Spinal mobilization, Cryotherapy, and Moist heat  PLAN FOR NEXT SESSION: UBE, manual therapy, chin tucks, and modalities as needed   Deryl Flora, PTA 12/08/2023, 4:51 PM

## 2023-12-12 DIAGNOSIS — E1165 Type 2 diabetes mellitus with hyperglycemia: Secondary | ICD-10-CM | POA: Diagnosis not present

## 2023-12-12 DIAGNOSIS — I10 Essential (primary) hypertension: Secondary | ICD-10-CM | POA: Diagnosis not present

## 2023-12-13 ENCOUNTER — Ambulatory Visit

## 2023-12-13 DIAGNOSIS — M542 Cervicalgia: Secondary | ICD-10-CM

## 2023-12-13 DIAGNOSIS — M62838 Other muscle spasm: Secondary | ICD-10-CM | POA: Diagnosis not present

## 2023-12-13 NOTE — Therapy (Signed)
 OUTPATIENT PHYSICAL THERAPY CERVICAL TREATMENT   Patient Name: Donald Mclaughlin MRN: [id number] DOB:1946/06/15, 78 y.o., male Today's Date: 12/13/2023  END OF SESSION:  PT End of Session - 12/13/23 1601     Visit Number 6    Number of Visits 8    Date for PT Re-Evaluation 01/21/24    PT Start Time 1600    PT Stop Time 1648    PT Time Calculation (min) 48 min    Activity Tolerance Patient tolerated treatment well    Behavior During Therapy [facility] for tasks assessed/performed            Past Medical History:  Diagnosis Date   Arthritis    Diabetes mellitus without complication (HCC)    High blood pressure    Thrombocytopenia (HCC) 12/31/2015   Past Surgical History:  Procedure Laterality Date   HIP SURGERY Right 2013   arthroplasty   TOTAL KNEE ARTHROPLASTY Left 12/12/2019   Procedure: TOTAL KNEE ARTHROPLASTY/ Left;  Surgeon: Darrin Emerald, MD;  Location: [facility];  Service: Orthopedics;  Laterality: Left;   Patient Active Problem List   Diagnosis Date Noted   S/P TKR (total knee replacement), left 12/12/19 02/05/2020   Primary osteoarthritis of left knee    Thrombocytopenia (HCC) 12/31/2015   Onychomycosis 05/08/2013   Pain, foot 05/08/2013   REFERRING PROVIDER: Everardo Hitch, MD  REFERRING DIAG: Cervicalgia  THERAPY DIAG:  Cervicalgia  Rationale for Evaluation and Treatment: Rehabilitation  ONSET DATE: 09/10/23  SUBJECTIVE:                                                                                                                                                                                                         SUBJECTIVE STATEMENT: Patient reports that he feels good today. He felt a little pain drying his back after a shower, but it has not hurt otherwise.   PERTINENT HISTORY:  Osteoarthritis, hypertension, and diabetes  PAIN:  Are you having pain? 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 No  LIVING ENVIRONMENT: Lives with: lives alone Lives in: House/apartment  OCCUPATION: retired  PLOF: Independent  PATIENT GOALS: reduced pain  NEXT MD VISIT: none scheduled  OBJECTIVE:  Note: Objective measures were completed at Evaluation unless otherwise noted.  PATIENT SURVEYS:  NDI 22% disability  COGNITION: Overall cognitive status: Within functional limits for tasks assessed  SENSATION: Patient reports intermittent numbness along left upper trapezius.   PALPATION: TTP: left upper trapezius and cervical paraspinals  CERVICAL JOINT MOBILITY:  C2-4: WFL and nonpainful   C4-6: hypomobile and painful   C6-T1: WFL and nonpainful   CERVICAL ROM:   Active ROM A/PROM (deg) eval  Flexion 36  Extension 24; familiar pain  Right lateral flexion 20; familiar pain  Left lateral flexion 30  Right rotation 62; familiar pain  Left rotation 45   (Blank rows = not tested)  UPPER EXTREMITY ROM: WFL for activities assessed  UPPER EXTREMITY MMT:  MMT Right eval Left eval  Shoulder flexion 4-/5 4-/5; familiar pain  Shoulder extension    Shoulder abduction 4/5 4/5; familiar pain  Shoulder adduction    Shoulder extension    Shoulder internal rotation    Shoulder external rotation    Middle trapezius    Lower trapezius    Elbow flexion 4+/5 4/5; familiar pain  Elbow extension 4-/5 4-/5; familiar pain  Wrist flexion    Wrist extension    Wrist ulnar deviation    Wrist radial deviation    Wrist pronation    Wrist supination    Grip strength 47 30   (Blank rows = not tested)  CERVICAL SPECIAL TESTS:  Distraction test: negative, familiar pain and Sharp pursor's test: Negative  TREATMENT DATE:                                       12/13/23  EXERCISE LOG  Exercise Repetitions and Resistance Comments  UBE  10 minutes @ 90 RPM   Therabar bending  Red t-bar x 3.5 minutes   Bilateral ER  Red t-band x 2 minutes   Manual therapy See below        Blank cell = exercise not performed today  Manual Therapy Soft Tissue Mobilization: left upper trapezius, levator scapulae, and scapular stabilizers, for reduced pain and tone  Modalities: no redness or adverse reaction to today's modalities  Date:  Unattended Estim: left upper trapezius, pre mod @ 80-150 Hz , 15 mins, Pain and Tone  12/08/23   EXERCISE LOG  Exercise Repetitions and Resistance Comments  UBE  8 minutes @ 120 RPM    Resisted row Green t-band x 3.5 minutes   Therabar bending Red t-band x 2.5 minutes each  Up and down; recommend attempting green t-bar at next appointment, as able  Manual therapy See below        Blank cell = exercise not performed today  Manual Therapy Soft Tissue Mobilization: left upper trapezius, levator scapulae, and scapular stabilizers, for reduced pain and tone   Modalities: no redness or adverse reaction to today's modalities  Date:  Unattended Estim: left upper trapezius, pre mod @ 80-150 Hz, 13 mins, Pain and Tone  12/06/23:      Small soundhead combo e'stim/US  at 1.50 W/CM2 x 12 minutes to patient's left cervical paraspinal musculature and UT f/b STW/M x 12 minutes with ischemic release technique utilized f/b HMP and IFC at 80-150 Hz on 40% scan x 20 minutes.  PATIENT EDUCATION:  Education details: anatomy Person educated: Patient Education method: Explanation Education comprehension: verbalized understanding  HOME EXERCISE PROGRAM:   ASSESSMENT:  CLINICAL IMPRESSION: Patient was introduced to resisted external rotation for improved scapular stabilization needed for overhead  lifting and other functional activities. He required minimal cueing with this intervention for proper biomechanics to facilitate posterior chain engagement. Manual therapy focused on soft tissue mobilization to his left upper trapezius as this was able to moderately reduce his familiar symptoms. He reported feeling good upon the conclusion of today's treatment. He continues to require skilled physical therapy to address his remaining impairments to return to his prior level of function.   OBJECTIVE IMPAIRMENTS: decreased activity tolerance, decreased ROM, decreased strength, hypomobility, impaired sensation, impaired tone, and pain.   ACTIVITY LIMITATIONS: sleeping  PARTICIPATION LIMITATIONS: community activity  PERSONAL FACTORS: Time since onset of injury/illness/exacerbation, Transportation, and 3+ comorbidities: Osteoarthritis, hypertension, and diabetes are also affecting patient's functional outcome.   REHAB POTENTIAL: Good  CLINICAL DECISION MAKING: Stable/uncomplicated  EVALUATION COMPLEXITY: Low   GOALS: Goals reviewed with patient? Yes  LONG TERM GOALS: Target date: 12/23/23  Patient will be independent with his HEP. Baseline:  Goal status: IN PROGRESS  2.  Patient will be able to demonstrate at least 60 degrees of left cervical rotation for improved awareness of his surroundings. Baseline: 6/11: 66 degrees Goal status: MET  3.  Patient will report being able to sleep without being awakened by his familiar cervical symptoms. Baseline: 6/11: once per night Goal status: IN PROGRESS  4.  Patient will be able to complete his daily activities without his familiar symptoms exceeding 4/10. Baseline: 6/11: 5-6/10 depending on activity Goal status: IN PROGRESS  5.  Patient will improve his NDI score to 12% or less for improved perceived function with his daily activities. Baseline:  Goal status: INITIAL  PLAN:  PT FREQUENCY: 2x/week  PT DURATION: 4 weeks  PLANNED  INTERVENTIONS: 16109- PT Re-evaluation, 97750- Physical Performance Testing, 97110-Therapeutic exercises, 97530- Therapeutic activity, [id number]- Neuromuscular re-education, 97535- Self Care, [id number]- Manual therapy, G0283- Electrical stimulation (unattended), Patient/Family education, Dry Needling, Joint mobilization, Spinal mobilization, Cryotherapy, and Moist heat  PLAN FOR NEXT SESSION: UBE, manual therapy, chin tucks, and modalities as needed   Lane Pinon, PT 12/13/2023, 4:52 PM

## 2023-12-16 ENCOUNTER — Ambulatory Visit: Admitting: *Deleted

## 2023-12-16 ENCOUNTER — Encounter: Payer: Self-pay | Admitting: *Deleted

## 2023-12-16 DIAGNOSIS — M542 Cervicalgia: Secondary | ICD-10-CM

## 2023-12-16 DIAGNOSIS — M62838 Other muscle spasm: Secondary | ICD-10-CM | POA: Diagnosis not present

## 2023-12-16 NOTE — Therapy (Signed)
 OUTPATIENT PHYSICAL THERAPY CERVICAL TREATMENT   Patient Name: Donald Mclaughlin MRN: [id number] DOB:05-09-1946, 78 y.o., male Today's Date: 12/16/2023  END OF SESSION:  PT End of Session - 12/16/23 1648     Visit Number 7    Number of Visits 8    Date for PT Re-Evaluation 01/21/24    Authorization - Number of Visits 8     PT Start Time 1648            Past Medical History:  Diagnosis Date   Arthritis    Diabetes mellitus without complication (HCC)    High blood pressure    Thrombocytopenia (HCC) 12/31/2015   Past Surgical History:  Procedure Laterality Date   HIP SURGERY Right 2013   arthroplasty   TOTAL KNEE ARTHROPLASTY Left 12/12/2019   Procedure: TOTAL KNEE ARTHROPLASTY/ Left;  Surgeon: Darrin Emerald, MD;  Location: [facility];  Service: Orthopedics;  Laterality: Left;   Patient Active Problem List   Diagnosis Date Noted   S/P TKR (total knee replacement), left 12/12/19 02/05/2020   Primary osteoarthritis of left knee    Thrombocytopenia (HCC) 12/31/2015   Onychomycosis 05/08/2013   Pain, foot 05/08/2013   REFERRING PROVIDER: Everardo Hitch, MD  REFERRING DIAG: Cervicalgia  THERAPY DIAG:  Cervicalgia  Other muscle spasm  Rationale for Evaluation and Treatment: Rehabilitation  ONSET DATE: 09/10/23  SUBJECTIVE:                                                                                                                                                                                                         SUBJECTIVE STATEMENT: Patient reports that he feels good today. He is doing better over all, but still has tightness in LT UT.  PERTINENT HISTORY:  Osteoarthritis, hypertension, and diabetes  PAIN:  Are you having pain? 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 No  LIVING ENVIRONMENT: Lives with: lives alone Lives in: House/apartment  OCCUPATION: retired  PLOF: Independent  PATIENT GOALS: reduced pain  NEXT MD VISIT: none scheduled  OBJECTIVE:  Note: Objective measures were completed at Evaluation unless otherwise noted.  PATIENT SURVEYS:  NDI 22% disability  COGNITION: Overall cognitive status: Within functional limits for tasks assessed  SENSATION: Patient reports intermittent numbness along left upper trapezius.  PALPATION: TTP: left upper trapezius and cervical paraspinals  CERVICAL JOINT MOBILITY:  C2-4: WFL and nonpainful   C4-6: hypomobile and painful   C6-T1: WFL and nonpainful   CERVICAL ROM:   Active ROM A/PROM (deg) eval  Flexion 36  Extension 24; familiar pain  Right lateral flexion 20; familiar pain  Left lateral flexion 30  Right rotation 62; familiar pain  Left rotation 45   (Blank rows = not tested)  UPPER EXTREMITY ROM: WFL for activities assessed  UPPER EXTREMITY MMT:  MMT Right eval Left eval  Shoulder flexion 4-/5 4-/5; familiar pain  Shoulder extension    Shoulder abduction 4/5 4/5; familiar pain  Shoulder adduction    Shoulder extension    Shoulder internal rotation    Shoulder external rotation    Middle trapezius    Lower trapezius    Elbow flexion 4+/5 4/5; familiar pain  Elbow extension 4-/5 4-/5; familiar pain  Wrist flexion    Wrist extension    Wrist ulnar deviation    Wrist radial deviation    Wrist pronation    Wrist supination    Grip strength 47 30   (Blank rows = not tested)  CERVICAL SPECIAL TESTS:  Distraction test: negative, familiar pain and Sharp pursor's test: Negative  TREATMENT DATE:  12/16/23 US  combo x 10 mins 1.5 w/cm2  to LT UT and cerv. Paras Manual STW/ TPR to LT UT and cerv. Paras IFC x 20 mins 100% scan. 80-150hz   seated.                                       12/13/23  EXERCISE LOG  Exercise Repetitions and Resistance Comments  UBE  10 minutes @ 90 RPM   Therabar bending  Red t-bar x 3.5 minutes   Bilateral ER  Red t-band x 2 minutes   Manual therapy See below        Blank cell = exercise not performed today  Manual Therapy Soft Tissue Mobilization: left upper trapezius, levator scapulae, and scapular stabilizers, for reduced pain and tone  Modalities: no redness or adverse reaction to today's modalities  Date:  Unattended Estim: left upper trapezius, pre mod @ 80-150 Hz , 15 mins, Pain and Tone  12/08/23   EXERCISE LOG  Exercise Repetitions and Resistance Comments  UBE  8 minutes @ 120 RPM    Resisted row Green t-band x 3.5 minutes   Therabar bending Red t-band x 2.5 minutes each  Up and down; recommend attempting green t-bar at next appointment, as able  Manual therapy See below        Blank cell = exercise not performed today  Manual Therapy Soft Tissue Mobilization: left upper trapezius, levator scapulae, and scapular stabilizers, for reduced pain and tone   Modalities: no redness or adverse reaction to today's modalities  Date:  Unattended Estim: left upper trapezius, pre mod @ 80-150 Hz, 13 mins, Pain and Tone  12/06/23:      Small soundhead combo e'stim/US  at 1.50 W/CM2 x 12 minutes to patient's left cervical paraspinal musculature and UT f/b STW/M x 12 minutes with ischemic release technique utilized f/b HMP and IFC at 80-150 Hz on 40% scan x 20 minutes.  PATIENT EDUCATION:  Education details: anatomy Person educated: Patient Education method: Explanation Education comprehension: verbalized understanding  HOME EXERCISE PROGRAM:   ASSESSMENT:  CLINICAL IMPRESSION: Patient arrived today with decreased TPs / pain,  and doing better overall, but still  with LT Utrap tightness/ pain. Rx focused on LT Utrap and cerv. Paras to decrease pain and trigger points with good releases in LT Utrap and cev. Paras.  IFC end of session tolerated very well. Check LTGs with possible DC   OBJECTIVE IMPAIRMENTS: decreased activity tolerance, decreased ROM, decreased strength, hypomobility, impaired sensation, impaired tone, and pain.   ACTIVITY LIMITATIONS: sleeping  PARTICIPATION LIMITATIONS: community activity  PERSONAL FACTORS: Time since onset of injury/illness/exacerbation, Transportation, and 3+ comorbidities: Osteoarthritis, hypertension, and diabetes are also affecting patient's functional outcome.   REHAB POTENTIAL: Good  CLINICAL DECISION MAKING: Stable/uncomplicated  EVALUATION COMPLEXITY: Low   GOALS: Goals reviewed with patient? Yes  LONG TERM GOALS: Target date: 12/23/23  Patient will be independent with his HEP. Baseline:  Goal status: IN PROGRESS  2.  Patient will be able to demonstrate at least 60 degrees of left cervical rotation for improved awareness of his surroundings. Baseline: 6/11: 66 degrees Goal status: MET  3.  Patient will report being able to sleep without being awakened by his familiar cervical symptoms. Baseline: 6/11: once per night Goal status: IN PROGRESS  4.  Patient will be able to complete his daily activities without his familiar symptoms exceeding 4/10. Baseline: 6/11: 5-6/10 depending on activity Goal status: IN PROGRESS  5.  Patient will improve his NDI score to 12% or less for improved perceived function with his daily activities. Baseline:  Goal status: INITIAL  PLAN:  PT FREQUENCY: 2x/week  PT DURATION: 4 weeks  PLANNED INTERVENTIONS: 97164- PT Re-evaluation, 97750- Physical Performance Testing, 97110-Therapeutic exercises, 97530- Therapeutic activity, [id number]- Neuromuscular re-education, 97535- Self Care, 14782- Manual therapy, G0283- Electrical stimulation (unattended), Patient/Family  education, Dry Needling, Joint mobilization, Spinal mobilization, Cryotherapy, and Moist heat  PLAN FOR NEXT SESSION: UBE, manual therapy, chin tucks, and modalities as needed.  Check LTGs with possible DC   Timoteo Carreiro,CHRIS, PTA 12/16/2023, 5:49 PM

## 2023-12-21 ENCOUNTER — Ambulatory Visit

## 2023-12-21 DIAGNOSIS — M62838 Other muscle spasm: Secondary | ICD-10-CM | POA: Diagnosis not present

## 2023-12-21 DIAGNOSIS — M542 Cervicalgia: Secondary | ICD-10-CM | POA: Diagnosis not present

## 2023-12-21 NOTE — Therapy (Signed)
 OUTPATIENT PHYSICAL THERAPY CERVICAL TREATMENT   Patient Name: Donald Mclaughlin MRN: [id number] DOB:25-Jul-1945, 78 y.o., male Today's Date: 12/21/2023  END OF SESSION:  PT End of Session - 12/21/23 1648     Visit Number 8    Number of Visits 8    Date for PT Re-Evaluation 01/21/24    Authorization - Number of Visits 8    PT Start Time 1645    PT Stop Time 1712    PT Time Calculation (min) 27 min            Past Medical History:  Diagnosis Date   Arthritis    Diabetes mellitus without complication (HCC)    High blood pressure    Thrombocytopenia (HCC) 12/31/2015   Past Surgical History:  Procedure Laterality Date   HIP SURGERY Right 2013   arthroplasty   TOTAL KNEE ARTHROPLASTY Left 12/12/2019   Procedure: TOTAL KNEE ARTHROPLASTY/ Left;  Surgeon: Margrette Taft BRAVO, MD;  Location: [facility];  Service: Orthopedics;  Laterality: Left;   Patient Active Problem List   Diagnosis Date Noted   S/P TKR (total knee replacement), left 12/12/19 02/05/2020   Primary osteoarthritis of left knee    Thrombocytopenia (HCC) 12/31/2015   Onychomycosis 05/08/2013   Pain, foot 05/08/2013   REFERRING PROVIDER: Maggie Hussar, MD  REFERRING DIAG: Cervicalgia  THERAPY DIAG:  Cervicalgia  Other muscle spasm  Rationale for Evaluation and Treatment: Rehabilitation  ONSET DATE: 09/10/23  SUBJECTIVE:                                                                                                                                                                                                         SUBJECTIVE STATEMENT: Patient reports that he feels good today. He is doing better over all, but still has tightness in LT UT.  PERTINENT HISTORY:  Osteoarthritis, hypertension, and diabetes  PAIN:  Are you having pain? No  PRECAUTIONS: None  RED FLAGS: None   WEIGHT BEARING RESTRICTIONS: No  FALLS:  Has patient fallen in last 6 months? No  LIVING ENVIRONMENT: Lives with: lives  alone Lives in: House/apartment  OCCUPATION: retired  PLOF: Independent  PATIENT GOALS: reduced pain  NEXT MD VISIT: none scheduled  OBJECTIVE:  Note: Objective measures were completed at Evaluation unless otherwise noted.  PATIENT SURVEYS:  NDI 22% disability  COGNITION: Overall cognitive status: Within functional limits for tasks assessed  SENSATION: Patient reports intermittent numbness along left upper trapezius.   PALPATION: TTP: left upper trapezius and cervical paraspinals  CERVICAL JOINT MOBILITY:  C2-4: WFL and nonpainful  C4-6: hypomobile and painful   C6-T1: WFL and nonpainful   CERVICAL ROM:   Active ROM A/PROM (deg) eval  Flexion 36  Extension 24; familiar pain  Right lateral flexion 20; familiar pain  Left lateral flexion 30  Right rotation 62; familiar pain  Left rotation 45   (Blank rows = not tested)  UPPER EXTREMITY ROM: WFL for activities assessed  UPPER EXTREMITY MMT:  MMT Right eval Left eval  Shoulder flexion 4-/5 4-/5; familiar pain  Shoulder extension    Shoulder abduction 4/5 4/5; familiar pain  Shoulder adduction    Shoulder extension    Shoulder internal rotation    Shoulder external rotation    Middle trapezius    Lower trapezius    Elbow flexion 4+/5 4/5; familiar pain  Elbow extension 4-/5 4-/5; familiar pain  Wrist flexion    Wrist extension    Wrist ulnar deviation    Wrist radial deviation    Wrist pronation    Wrist supination    Grip strength 47 30   (Blank rows = not tested)  CERVICAL SPECIAL TESTS:  Distraction test: negative, familiar pain and Sharp pursor's test: Negative  TREATMENT DATE:      12/21/23                                 EXERCISE LOG  Exercise Repetitions and Resistance Comments  UBE 10 mins forward/backward   Goal Assessment See Below    Blank cell = exercise not performed today   12/16/23 US  combo x 10 mins 1.5 w/cm2  to LT UT and cerv. Paras Manual STW/ TPR to LT UT and cerv.  Paras IFC x 20 mins 100% scan. 80-150hz  seated.                                       12/13/23  EXERCISE LOG  Exercise Repetitions and Resistance Comments  UBE  10 minutes @ 90 RPM   Therabar bending  Red t-bar x 3.5 minutes   Bilateral ER  Red t-band x 2 minutes   Manual therapy See below        Blank cell = exercise not performed today  Manual Therapy Soft Tissue Mobilization: left upper trapezius, levator scapulae, and scapular stabilizers, for reduced pain and tone  Modalities: no redness or adverse reaction to today's modalities  Date:  Unattended Estim: left upper trapezius, pre mod @ 80-150 Hz , 15 mins, Pain and Tone  12/08/23   EXERCISE LOG  Exercise Repetitions and Resistance Comments  UBE  8 minutes @ 120 RPM    Resisted row Green t-band x 3.5 minutes   Therabar bending Red t-band x 2.5 minutes each  Up and down; recommend attempting green t-bar at next appointment, as able  Manual therapy See below        Blank cell = exercise not performed today  Manual Therapy Soft Tissue Mobilization: left upper trapezius, levator scapulae, and scapular stabilizers, for reduced pain and tone   Modalities: no redness or adverse reaction to today's modalities  Date:  Unattended Estim: left upper trapezius, pre mod @ 80-150 Hz, 13 mins, Pain and Tone   PATIENT EDUCATION:  Education details: anatomy Person educated: Patient Education method: Explanation Education comprehension: verbalized understanding  HOME EXERCISE PROGRAM:   ASSESSMENT:  CLINICAL IMPRESSION: Patient arrives for  today's treatment session denying any pain.  Pt able to decrease NDI score to 16% making good progress towards his LTG.  Pt has met all other goals set forth for him by physical therapy at this time.  All pt's questions answered to pt's satisfaction Pt encouraged to call the facility with any further questions or concerns.   Pt denied any pain at completion of today's treatment session.  Pt ready  for discharge at this time.   PHYSICAL THERAPY DISCHARGE SUMMARY  Visits from Start of Care: 8  Current functional level related to goals / functional outcomes: Patient was able to meet or nearly meet all of his goals for skilled physical therapy.    Remaining deficits: Intermittent pain    Education / Equipment: HEP    Patient agrees to discharge. Patient goals were partially met. Patient is being discharged due to being pleased with the current functional level.  Lacinda Fass, PT, DPT   OBJECTIVE IMPAIRMENTS: decreased activity tolerance, decreased ROM, decreased strength, hypomobility, impaired sensation, impaired tone, and pain.   ACTIVITY LIMITATIONS: sleeping  PARTICIPATION LIMITATIONS: community activity  PERSONAL FACTORS: Time since onset of injury/illness/exacerbation, Transportation, and 3+ comorbidities: Osteoarthritis, hypertension, and diabetes are also affecting patient's functional outcome.   REHAB POTENTIAL: Good  CLINICAL DECISION MAKING: Stable/uncomplicated  EVALUATION COMPLEXITY: Low   GOALS: Goals reviewed with patient? Yes  LONG TERM GOALS: Target date: 12/23/23  Patient will be independent with his HEP. Baseline:  Goal status: MET  2.  Patient will be able to demonstrate at least 60 degrees of left cervical rotation for improved awareness of his surroundings. Baseline: 6/11: 66 degrees Goal status: MET  3.  Patient will report being able to sleep without being awakened by his familiar cervical symptoms. Baseline: 6/11: once per night; 6/24: once or twice a week Goal status: PARTIALLY MET  4.  Patient will be able to complete his daily activities without his familiar symptoms exceeding 4/10. Baseline: 6/11: 5-6/10 depending on activity; 6/24: 3-4/10 Goal status: MET  5.  Patient will improve his NDI score to 12% or less for improved perceived function with his daily activities. Baseline: 16% Goal status: IN PROGRESS  PLAN:  PT  FREQUENCY: 2x/week  PT DURATION: 4 weeks  PLANNED INTERVENTIONS: 97164- PT Re-evaluation, 97750- Physical Performance Testing, 97110-Therapeutic exercises, 97530- Therapeutic activity, [id number]- Neuromuscular re-education, 97535- Self Care, [id number]- Manual therapy, G0283- Electrical stimulation (unattended), Patient/Family education, Dry Needling, Joint mobilization, Spinal mobilization, Cryotherapy, and Moist heat  PLAN FOR NEXT SESSION: UBE, manual therapy, chin tucks, and modalities as needed.  Check LTGs with possible DC   Donald Mclaughlin, PTA 12/21/2023, 5:34 PM

## 2023-12-23 ENCOUNTER — Encounter: Admitting: *Deleted

## 2024-01-03 DIAGNOSIS — L080 Pyoderma: Secondary | ICD-10-CM | POA: Diagnosis not present

## 2024-01-03 DIAGNOSIS — I10 Essential (primary) hypertension: Secondary | ICD-10-CM | POA: Diagnosis not present

## 2024-01-03 DIAGNOSIS — E1169 Type 2 diabetes mellitus with other specified complication: Secondary | ICD-10-CM | POA: Diagnosis not present

## 2024-02-03 DIAGNOSIS — E1165 Type 2 diabetes mellitus with hyperglycemia: Secondary | ICD-10-CM | POA: Diagnosis not present

## 2024-02-03 DIAGNOSIS — I10 Essential (primary) hypertension: Secondary | ICD-10-CM | POA: Diagnosis not present

## 2024-03-05 DIAGNOSIS — I10 Essential (primary) hypertension: Secondary | ICD-10-CM | POA: Diagnosis not present

## 2024-03-05 DIAGNOSIS — E1165 Type 2 diabetes mellitus with hyperglycemia: Secondary | ICD-10-CM | POA: Diagnosis not present

## 2024-04-04 DIAGNOSIS — E1165 Type 2 diabetes mellitus with hyperglycemia: Secondary | ICD-10-CM | POA: Diagnosis not present

## 2024-04-04 DIAGNOSIS — I10 Essential (primary) hypertension: Secondary | ICD-10-CM | POA: Diagnosis not present

## 2024-04-18 DIAGNOSIS — L03115 Cellulitis of right lower limb: Secondary | ICD-10-CM | POA: Diagnosis not present

## 2024-04-18 DIAGNOSIS — E1169 Type 2 diabetes mellitus with other specified complication: Secondary | ICD-10-CM | POA: Diagnosis not present

## 2024-04-18 DIAGNOSIS — E119 Type 2 diabetes mellitus without complications: Secondary | ICD-10-CM | POA: Diagnosis not present

## 2024-04-18 DIAGNOSIS — I10 Essential (primary) hypertension: Secondary | ICD-10-CM | POA: Diagnosis not present

## 2024-04-18 DIAGNOSIS — E785 Hyperlipidemia, unspecified: Secondary | ICD-10-CM | POA: Diagnosis not present

## 2024-04-18 DIAGNOSIS — L03116 Cellulitis of left lower limb: Secondary | ICD-10-CM | POA: Diagnosis not present

## 2024-04-18 DIAGNOSIS — R600 Localized edema: Secondary | ICD-10-CM | POA: Diagnosis not present
